# Patient Record
Sex: Female | Born: 1965 | Race: Black or African American | Hispanic: Yes | Marital: Single | Smoking: Never smoker
Health system: Southern US, Community
[De-identification: ages and names within clinical notes are randomized; demographics above are authoritative.]

## PROBLEM LIST (undated history)

## (undated) DIAGNOSIS — F419 Anxiety disorder, unspecified: Secondary | ICD-10-CM

## (undated) DIAGNOSIS — F32A Depression, unspecified: Secondary | ICD-10-CM

## (undated) DIAGNOSIS — J45909 Unspecified asthma, uncomplicated: Secondary | ICD-10-CM

## (undated) DIAGNOSIS — F329 Major depressive disorder, single episode, unspecified: Secondary | ICD-10-CM

## (undated) DIAGNOSIS — C801 Malignant (primary) neoplasm, unspecified: Secondary | ICD-10-CM

## (undated) HISTORY — DX: Malignant (primary) neoplasm, unspecified: C80.1

## (undated) HISTORY — PX: ABDOMINAL HYSTERECTOMY: SHX81

## (undated) HISTORY — PX: OTHER SURGICAL HISTORY: SHX169

## (undated) HISTORY — DX: Anxiety disorder, unspecified: F41.9

## (undated) HISTORY — DX: Major depressive disorder, single episode, unspecified: F32.9

## (undated) HISTORY — PX: TONSILLECTOMY: SUR1361

## (undated) HISTORY — DX: Depression, unspecified: F32.A

---
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
## 2005-06-21 DIAGNOSIS — C801 Malignant (primary) neoplasm, unspecified: Secondary | ICD-10-CM

## 2005-06-21 HISTORY — DX: Malignant (primary) neoplasm, unspecified: C80.1

## 2012-06-21 HISTORY — PX: BRAIN SURGERY: SHX531

## 2016-09-28 ENCOUNTER — Other Ambulatory Visit: Payer: Self-pay | Admitting: Neurology

## 2016-09-28 DIAGNOSIS — I671 Cerebral aneurysm, nonruptured: Secondary | ICD-10-CM

## 2016-10-13 ENCOUNTER — Ambulatory Visit
Admission: RE | Admit: 2016-10-13 | Discharge: 2016-10-13 | Disposition: A | Discharge status: Home / Self Care | Payer: BLUE CROSS/BLUE SHIELD | Source: Ambulatory Visit | Attending: Neurology | Admitting: Neurology

## 2016-10-13 DIAGNOSIS — I671 Cerebral aneurysm, nonruptured: Secondary | ICD-10-CM | POA: Diagnosis present

## 2016-10-13 MED ORDER — IOPAMIDOL (ISOVUE-370) INJECTION 76%
75.0000 mL | Freq: Once | INTRAVENOUS | Status: AC | PRN
  Administered 2016-10-13: 75 mL via INTRAVENOUS

## 2016-12-01 ENCOUNTER — Encounter: Payer: Self-pay | Admitting: Physical Therapy

## 2016-12-01 ENCOUNTER — Ambulatory Visit: Payer: BLUE CROSS/BLUE SHIELD | Attending: Psychiatry | Admitting: Physical Therapy

## 2016-12-01 VITALS — BP 90/60

## 2016-12-01 DIAGNOSIS — M6281 Muscle weakness (generalized): Secondary | ICD-10-CM | POA: Diagnosis present

## 2016-12-01 DIAGNOSIS — M533 Sacrococcygeal disorders, not elsewhere classified: Secondary | ICD-10-CM | POA: Diagnosis not present

## 2016-12-01 DIAGNOSIS — R2689 Other abnormalities of gait and mobility: Secondary | ICD-10-CM | POA: Insufficient documentation

## 2016-12-01 NOTE — Therapy (Signed)
[place] MAIN [facility] SERVICES [address] [place], [place], [address] Phone: [phone]   Fax:  [phone]  Physical Therapy Evaluation  Patient Details  Name: Joann Brown MRN: [id number] Date of Birth: [birth year] Referring Provider: Bernadene Bell, MD   Encounter Date: 12/01/2016      PT End of Session - 12/01/16 1316    Visit Number 1   Number of Visits 12   Date for PT Re-Evaluation 02/23/17   PT Start Time 0910   PT Stop Time 1015   PT Time Calculation (min) 65 min   Activity Tolerance Patient tolerated treatment well;No increased pain   Behavior During Therapy WFL for tasks assessed/performed      Past Medical History:  Diagnosis Date  . Anxiety   . Cancer [facility]) 2007    hysterectomy to remove tumor in uterus   . Depression     Past Surgical History:  Procedure Laterality Date  . ABDOMINAL HYSTERECTOMY     2/2 tumor in uterus   . APPENDECTOMY  1986  . BRAIN SURGERY  2014   coil placement for brain aneurysm     Vitals:   12/01/16 0930  BP: 90/60         Subjective Assessment - 12/01/16 0931    Subjective 1) Pt has experienced pelvic pain (10-12/10) since 2015 at the R lower abdomen.  Pain has gotten worse. Pain radiates to medial knee/ shin to second and third toes like an electric shock at random times in any positions.  Pt is supposed to get an [place] at [facility].  She describes the pain like something is twisted in her R side, almosted ripped. Pt had a hysterectomy in 2007 to remove a uterine tumor. Pt's MD informed her that her  L ovary was dissolved with her R ovary remaining.  Pt also had an appendectomy in 1986 in addition to the removal of 2 cysts over R ovary.  Pain comes on the tying her shoes, pulling knee to chest, and bending. Pt has difficulty with vaccuming due to pain.  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 Pt has pain  in her back when she lifts. 2) SUI with coughing and sneeze.  3) LBP since 2009. Radiating to back of the feet.  Bending and lifting 5-6 x day at work.  Pt has undergone PT without improvement.  4)  Pt had a fall last year 02/2016  and landed on her tailbone.  Pt has tailbone pain with long periods of sitting for > 1 hour and laying on her tummy, walking on her job for 7 hours.  Pain level 8/10   5) constipation  very 2-3 days "It is a nightmare for me" every 3 weeks associated diverticulosis.       Pertinent History Diverticulosis, Brain aneurysm 2014 with coil placement at the top of her head. Since this surgery, pt experience headaches. Pt has had dizziness the past 3 years. Pt's neutrologist and PCP are aware of her Sx. Pt is undergoing sleep study for OSA 6/15.  4 miscarriages, 2 vaginal deliveries, with episiotomy.    Patient Stated Goals decrease R abdominal pain            [facility] PT Assessment - 12/01/16 0957      Assessment   Medical Diagnosis pelvic pain    Referring Provider Bernadene Bell, MD  Precautions   Precautions None     Restrictions   Weight Bearing Restrictions No     Balance Screen   Has the patient fallen in the past 6 months No     Squat   Comments COM more anterior than knees     Single Leg Stance   Comments LOB with shooting from hips up the spine on B ( worst on R) when in R SLS pre Tx, no LOB and more stability post Tx        AROM   Overall AROM  --  hip flexion at 90 deg due to pain ( pre Tx) full ROM ( post      Palpation   SI assessment  shooting pain up spine with palpation at B PSIS, R ASIS more anterior, R coccyx deviation with tenderness/ tensions at coccygeus R ( post Tx: decreased and more mobility into hip flexion)    R SIJ limited mobility    Palpation comment pain along linea alba but no separation            Objective measurements completed on examination: See above findings.        Pelvic Floor Special Questions -  12/01/16 1021    External Perineal Exam through clothing    External Palpation B obturator internus, coccygeus B (R > L)           [facility] Adult PT Treatment/Exercise - 12/01/16 1058      Therapeutic Activites    Therapeutic Activities --  see pt instructions     Manual Therapy   Manual therapy comments R LE long axis distraction, inferior/ superior mob of sacrum, MWM hip abd/ add, STM along coccygeus and sacrotuberous ligament, rotational mob into hip flexion                       PT Long Term Goals - 12/01/16 1313      PT LONG TERM GOAL #1   Title Pt will demo no coccyx deviation R and lno tenderness / tensions at coccygeus across 2 visits in order to minimize pain and be able to sit / stand for longer periods    Time 12   Period Weeks   Status New     PT LONG TERM GOAL #2   Title Pt will be able to demo decreased abdominal scar restrictions in order to progress to deep core strengthening and minimize pain    Time 12   Period Weeks   Status New     PT LONG TERM GOAL #3   Title Pt will demo increaed hip flexion from 90 deg to full ROM and no R SIJ hypomobility/ pelvic girdle obliquities in supine in order to progress to being able to tie shoes    Time 12   Period Weeks   Status New     PT LONG TERM GOAL #4   Title Pt will decrease her [place] score from 41% to < 36 % in order to work and lift at work with less LBP and to walk without less abdominal pain   Time 12   Period Weeks   Status New                Plan - 12/01/16 1317    Clinical Impression Statement Pt is 51 yo female who reports R LQ abdominal pain which radiates over medial thigh/tibia, tailbone pain, constipation , and CLBP with radiating pain down to posterior heel R >  L.  These deficits impact her ability to sti, stand, vaccuum, lift, and perform work duties. Pt 's clinical presentations include coccyx deviation, limited SIJ arthrokinematics mobility, pelvic obliquities, decreased hip  flexion ROM no RLE, decreased balance on RLE, and increased scar restrictions over R LQ of abdomen. Following manual Tx which address coccyx deviation and pelvic obliquities, pt demo'd increased  Hip flexion ROM and more stability with R SLS.  Plan to withhold internal pelvic assessment until PT f/u with the her pending US imaging.     Rehab Potential Good   PT Frequency 1x / week   PT Treatment/Interventions ADLs/Self Care Home Management;Aquatic Therapy;Neuromuscular re-education;Therapeutic exercise;Balance training;Moist Heat;Traction;Patient/family education;Gait training;Stair training;Scar mobilization;Manual lymph drainage;Manual techniques;Energy conservation;Taping;Functional mobility training;Therapeutic activities   Consulted and Agree with Plan of Care Patient      Patient will benefit from skilled therapeutic intervention in order to improve the following deficits and impairments:  Pain, Improper body mechanics, Decreased mobility, Decreased strength, Postural dysfunction, Decreased scar mobility, Hypomobility, Decreased activity tolerance, Decreased balance, Decreased endurance, Decreased range of motion, Decreased coordination, Increased muscle spasms, Decreased safety awareness, Impaired sensation  Visit Diagnosis: Sacrococcygeal disorders, not elsewhere classified  Other abnormalities of gait and mobility  Muscle weakness (generalized)     Problem List There are no active problems to display for this patient.   Jerl Mina ,PT, DPT, E-RYT   12/01/2016, 3:28 PM  [place] MAIN [facility] SERVICES [address] [place], [place], [address] Phone: [phone]   Fax:  [phone]  Name: Joann Brown MRN: [id number] Date of Birth: 1966-06-01

## 2016-12-01 NOTE — Patient Instructions (Signed)
Knee to chest on exhale  5 reps     Lay on L side, pillow between knees Lift L ankle up on exhale with knee between pillows  10 x 2

## 2016-12-10 ENCOUNTER — Encounter: Payer: BLUE CROSS/BLUE SHIELD | Admitting: Physical Therapy

## 2016-12-15 ENCOUNTER — Ambulatory Visit: Payer: BLUE CROSS/BLUE SHIELD | Admitting: Physical Therapy

## 2016-12-24 ENCOUNTER — Ambulatory Visit: Payer: BLUE CROSS/BLUE SHIELD | Attending: Psychiatry | Admitting: Physical Therapy

## 2016-12-24 DIAGNOSIS — R2689 Other abnormalities of gait and mobility: Secondary | ICD-10-CM | POA: Diagnosis present

## 2016-12-24 DIAGNOSIS — M6281 Muscle weakness (generalized): Secondary | ICD-10-CM | POA: Insufficient documentation

## 2016-12-24 DIAGNOSIS — M533 Sacrococcygeal disorders, not elsewhere classified: Secondary | ICD-10-CM | POA: Insufficient documentation

## 2016-12-24 NOTE — Therapy (Addendum)
[place] MAIN [facility] SERVICES [address] [place], [place], [address] Phone: [phone]   Fax:  [phone]  Physical Therapy Treatment  Patient Details  Name: Joann Brown MRN: [id number] Date of Birth: 1966-01-08 Referring Provider: Bernadene Bell, MD   Encounter Date: 12/24/2016      PT End of Session - 12/24/16 1259    Visit Number 2   Number of Visits 12   Date for PT Re-Evaluation 02/23/17   PT Start Time 1010   PT Stop Time 1120   PT Time Calculation (min) 70 min   Activity Tolerance Patient tolerated treatment well;No increased pain   Behavior During Therapy WFL for tasks assessed/performed      Past Medical History:  Diagnosis Date  . Anxiety   . Cancer [facility]) 2007    hysterectomy to remove tumor in uterus   . Depression     Past Surgical History:  Procedure Laterality Date  . ABDOMINAL HYSTERECTOMY     2/2 tumor in uterus   . APPENDECTOMY  1986  . BRAIN SURGERY  2014   coil placement for brain aneurysm     There were no vitals filed for this visit.      Subjective Assessment - 12/24/16 1011    Subjective Pt reported the relief in her back after last session  lasted for half a day. Pt has continued her HEP which has helped to decrease the intensity of LBP from 10+/10 to 7/10. Constipation has improved with bowel movements occuring 2-3 x day instead of every 2-3 days. The remaining the past weeks have been the radiating pain ac ross R thigh and then back of calf to the bottom of the foot to the 2nd-4th toes. Pt also reports numbness in her R hand past the elbow to the 3rd -4th fingers which occurs 2-3x a day "all by itself" even when she is laying all positions.  When she lays on her R side, her R side falls asleep immediately.    Pt went to the ER 2 weeks ago for abdominal pain and US showed no R ovary but the CT scan showed that the R ovary was behind other organs. Pt declined surgery and was referred to a  specialist at [facility] with an appt coming up this month. Pt described the abdominal pain that led her to the ER as if "her hips were opening up like giving birth." She did not feel the hip pain accompany the R abdominal pain in the past.  Pt has been taking Naxoproxen and Advil with decrease in pain but "not enough".  The abdominal pain with the hip pain has been remained since 12/09/16.  Today, pain 5-6/10.      Patient is accompained by: Interpreter   Pertinent History Diverticulosis, Brain aneurysm 2014 with coil placement at the top of her head. Since this surgery, pt experience headaches. Pt has had dizziness the past 3 years. Pt's neutrologist and PCP are aware of her Sx. Pt is undergoing sleep study for OSA 6/15.  4 miscarriages, 2 vaginal deliveries, with episiotomy.    Patient Stated Goals decrease R abdominal pain            [facility] PT Assessment - 12/24/16 1250      Sensation   Light Touch --  decreased sensation at L3 R leg > L.      Palpation   SI assessment  R ASIS symmetrically aligned with L . No deviation of coccyx.  Increased mobility at  R SIJ but palpation there along with ischial rami caused radiating pain                   Pelvic Floor Special Questions - 12/24/16 1251    External Palpation B obt in, coccygeus    Prolapse Posterior Wall  cough: slightly outside introitus, post Tx: inside introitus   Pelvic Floor Internal Exam pt consented verbally with out contraindications  female intepreter in the room   Exam Type Vaginal   Palpation tenderness/ tensions at R bulbospoongious, B  ischicoccygeus and obt int B ,     post Tx: less tenderness/ tensions.   Strength fair squeeze, definite lift           [facility] Adult PT Treatment/Exercise - 12/24/16 1256      Therapeutic Activites    Therapeutic Activities --  see pt instructions     Neuro Re-ed    Neuro Re-ed Details  pain science education w/ paced breathing to minimize mm tensions      Manual Therapy    Manual therapy comments sustained pressure at ischial rami B with internal technique to release coccygeus/ obt int.    Internal Pelvic Floor sustained pressure at problem areas noted in assessment, lightening up to minimize pain.                       PT Long Term Goals - 12/01/16 1313      PT LONG TERM GOAL #1   Title Pt will demo no coccyx deviation R and lno tenderness / tensions at coccygeus across 2 visits in order to minimize pain and be able to sit / stand for longer periods    Time 12   Period Weeks   Status New     PT LONG TERM GOAL #2   Title Pt will be able to demo decreased abdominal scar restrictions in order to progress to deep core strengthening and minimize pain    Time 12   Period Weeks   Status New     PT LONG TERM GOAL #3   Title Pt will demo increaed hip flexion from 90 deg to full ROM and no R SIJ hypomobility/ pelvic girdle obliquities in supine in order to progress to being able to tie shoes    Time 12   Period Weeks   Status New     PT LONG TERM GOAL #4   Title Pt will decrease her [place] score from 41% to < 36 % in order to work and lift at work with less LBP and to walk without less abdominal pain   Time 12   Period Weeks   Status New               Plan - 12/24/16 1300    Clinical Impression Statement Pt returns after her first visit with report of 30% improvement in her LBP as she continues with her HEP. Pt also reported her constipation has improved with daily movements 2-3x /day instead of 2-3 x day following last session when PT applied manual Tx to address hypomobility at R SIJ and deviated coccyx.   Today, pt showed good carry over from last Tx with a more symmetrically aligned pelvic girdle and no deviation of her coccyx. However, through internal vaginal assessment, pt demo'd potential signs of pelvic organ prolapse as indicated by a lowered position of her posterior vaginal wall slightly outside her intriotus. PT suspects this  issue to be associated w/ repeated lifting and handling heavy objects at her job which causes a downward load onto her pelvic floor.  Pt is at risk for worsening of pelvic organ prolapse with her Hx of hysterectomy. Pt would benefit from an order for lifting restrictions at work for 6 weeks while pt undergoes pelvic floor rehab to strengthen her muscles and her deep core mm.    Following Tx today, pt demo'd decreased tenderness and tensions at B coccgyeus and obturator internus mm and R bulbospingious mm. Pt also demo'd proper coordination of her pelvic floor to elicit a more cranial position of her rectum inside her introitus following Tx.   Following Tx today, pt reported her pain decreased from 5-6/10 to 2-3/10.  Suspect pt's radiating LBP is related to her dysfunctions of pelvic floor and lumbopelvic complex ( limited SIJ mobility, overactivity of pelvic floor mm, poor intraabdominal pressure system 2/2 hysetrectomy). Plan to address her radiating Sx in RLE and UE at next sessions and to refer out if not related to musculoskeletal causes. PT plans to communicate with her MD re: recommendation for pt to receive a lifting restriction note for work. Pt continues to benefit from skilled PT with increased frequency of 2x/ week in stead of 1x/week.       Rehab Potential Good   PT Frequency 1x / week   PT Treatment/Interventions ADLs/Self Care Home Management;Aquatic Therapy;Neuromuscular re-education;Therapeutic exercise;Balance training;Moist Heat;Traction;Patient/family education;Gait training;Stair training;Scar mobilization;Manual lymph drainage;Manual techniques;Energy conservation;Taping;Functional mobility training;Therapeutic activities   Consulted and Agree with Plan of Care Patient      Patient will benefit from skilled therapeutic intervention in order to improve the following deficits and impairments:  Pain, Improper body mechanics, Decreased mobility, Decreased strength, Postural dysfunction,  Decreased scar mobility, Hypomobility, Decreased activity tolerance, Decreased balance, Decreased endurance, Decreased range of motion, Decreased coordination, Increased muscle spasms, Decreased safety awareness, Impaired sensation  Visit Diagnosis: Sacrococcygeal disorders, not elsewhere classified  Other abnormalities of gait and mobility  Muscle weakness (generalized)     Problem List There are no active problems to display for this patient.   Jerl Mina ,PT, DPT, E-RYT  12/24/2016, 1:13 PM  [place] MAIN [facility] SERVICES [address] [place], [place], [address] Phone: [phone]   Fax:  [phone]  Name: Cayman Brogden MRN: [id number] Date of Birth: May 11, 1966

## 2016-12-28 ENCOUNTER — Ambulatory Visit: Payer: BLUE CROSS/BLUE SHIELD | Admitting: Physical Therapy

## 2016-12-28 DIAGNOSIS — M533 Sacrococcygeal disorders, not elsewhere classified: Secondary | ICD-10-CM | POA: Diagnosis not present

## 2016-12-28 DIAGNOSIS — R2689 Other abnormalities of gait and mobility: Secondary | ICD-10-CM

## 2016-12-28 DIAGNOSIS — M6281 Muscle weakness (generalized): Secondary | ICD-10-CM

## 2016-12-28 NOTE — Patient Instructions (Signed)
Prone Heel Press for strengthening sacro-iliac joints  1. Lie on your belly. If you have an arch in your low back or it feels umcomfortable, place a pillow under your low belly/hips to make sure your low back feel comfortable.   2. Place our forehead on top of your palms.      Widen your knees apart for starting position.   3. Inhale, feel belly and low back expand  4. Exhale, feel belly hug in, press heel together and count aloud for 5 sec. Then relax the heel squeezing.  Perform 10 reps of 5 sec holds. 2 sets/ day.    If you feel entire buttock tighten too much or feel low back pain, apply 50% less effort. As you press your heel together, you will feel as if your pubic bone (front of your pelvis) and sacrum (back of your pelvis) gentle move towards each other or your low abdominal muscles hug in more.         Frog stretch: laying on belly with pillow under hips, knees bent, inhale do nothing, exhale let ankles fall apart  10 reps    _______________   childs pose rocking   5 reps   Standing version  Center, left, right  5 reps     and relaxation with pillows under belly, forehead, between knees  76min to stretch back after work

## 2016-12-28 NOTE — Addendum Note (Signed)
Addended by: Jerl Mina on: 12/28/2016 11:28 PM   Modules accepted: Orders

## 2016-12-28 NOTE — Therapy (Signed)
[place] MAIN [facility] SERVICES [address] [place], [place], [address] Phone: [phone]   Fax:  [phone]  Physical Therapy Treatment  Patient Details  Name: Joann Brown MRN: [id number] Date of Birth: 03/19/1966 Referring Provider: Bernadene Bell, MD   Encounter Date: 12/28/2016      PT End of Session - 12/28/16 1336    Visit Number 3   Number of Visits 12   Date for PT Re-Evaluation 02/23/17   PT Start Time 2035   PT Stop Time 1335   PT Time Calculation (min) 60 min   Activity Tolerance Patient tolerated treatment well;No increased pain   Behavior During Therapy WFL for tasks assessed/performed      Past Medical History:  Diagnosis Date  . Anxiety   . Cancer [facility]) 2007    hysterectomy to remove tumor in uterus   . Depression     Past Surgical History:  Procedure Laterality Date  . ABDOMINAL HYSTERECTOMY     2/2 tumor in uterus   . APPENDECTOMY  1986  . BRAIN SURGERY  2014   coil placement for brain aneurysm     There were no vitals filed for this visit.      Subjective Assessment - 12/28/16 1238    Subjective Pt reported her pain relief to a level of 2-3/10 from 5-6/10 for a few hours after last session. The following days, the pain went up 8-9+/10 but when she did the new exercises, the pain relaxed quite a bit to a level of 5/10.  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  Pt felt less pain with less lifting. Pt saw a doctor about her radiating pain in her shoulders. MD told her she has a pinched nerve in her neck but imaging was performed.  The pain in the R arm and fingers is better by 30% and numbness and tingling have lessened. Pt will continue to see her MD for the arm / shohulder pain after performing her exercises he prescribed her. Pt reported she will undergo breast reduction surgery in 6-8 weeks.       Patient is accompained by: Interpreter   Pertinent History Diverticulosis,  Brain aneurysm 2014 with coil placement at the top of her head. Since this surgery, pt experience headaches. Pt has had dizziness the past 3 years. Pt's neutrologist and PCP are aware of her Sx. Pt is undergoing sleep study for OSA 6/15.  4 miscarriages, 2 vaginal deliveries, with episiotomy.    Patient Stated Goals decrease R abdominal pain            [facility] PT Assessment - 12/28/16 1249      AROM   Overall AROM  --  R sideflexion/rotation limited by pain ~20% preTx,45%postTx     Palpation   Spinal mobility increased paraspinal mm tensions along R > L, T12 / S1 reproduced radiating pain.  ( post Tx: no radiating pain)    SI assessment  hypomobility along lateral border, coccgyeus ( post manual mob in [place] :, less tenderness )                      [facility] Adult PT Treatment/Exercise - 12/28/16 1328      Exercises   Exercises --  see pt instructions     Moist Heat Therapy   Number Minutes Moist Heat 5 Minutes   Moist Heat Location --  back and buttocks  Manual Therapy   Manual therapy comments long axis distraction RLE, FADDIR mobs Grade II, hip ext, flex, rotational mob iliac crest over sacrum, inferioro suprerior mob of sacrum, STM at coccgeus.   STM at R paraspinal, Grade III mob along T10-12                  PT Education - 12/28/16 1335    Education provided Yes   Education Details HEP   Person(s) Educated Patient   Methods Explanation;Demonstration;Tactile cues;Verbal cues;Handout   Comprehension Returned demonstration;Verbalized understanding             PT Long Term Goals - 12/01/16 1313      PT LONG TERM GOAL #1   Title Pt will demo no coccyx deviation R and lno tenderness / tensions at coccygeus across 2 visits in order to minimize pain and be able to sit / stand for longer periods    Time 12   Period Weeks   Status New     PT LONG TERM GOAL #2   Title Pt will be able to demo decreased abdominal scar restrictions in order to  progress to deep core strengthening and minimize pain    Time 12   Period Weeks   Status New     PT LONG TERM GOAL #3   Title Pt will demo increaed hip flexion from 90 deg to full ROM and no R SIJ hypomobility/ pelvic girdle obliquities in supine in order to progress to being able to tie shoes    Time 12   Period Weeks   Status New     PT LONG TERM GOAL #4   Title Pt will decrease her [place] score from 41% to < 36 % in order to work and lift at work with less LBP and to walk without less abdominal pain   Time 12   Period Weeks   Status New               Plan - 12/28/16 1338    Clinical Impression Statement Pt reports the exercises from last session have helped her to minimize her pain when it escalates. Today's sessions focused on decreasing her R radiating pain  which was found to be caused by increased mm tensions of paraspinals on R, hypomobility at T10-12  and R lateral border of sacroiliac crest. Following external manual Tx, pt demo'd increased spinal side flexion and rotation to the R and reported decreased to 1/10 with no radiating pain down RLE.  Pt continues to benefit from skilled PT with a frequency of 2x/ week.  Plan to reassess pelvic floor mm tensions at next session.    Rehab Potential Good   PT Frequency 2x / week   PT Treatment/Interventions ADLs/Self Care Home Management;Aquatic Therapy;Neuromuscular re-education;Therapeutic exercise;Balance training;Moist Heat;Traction;Patient/family education;Gait training;Stair training;Scar mobilization;Manual lymph drainage;Manual techniques;Energy conservation;Taping;Functional mobility training;Therapeutic activities   Consulted and Agree with Plan of Care Patient      Patient will benefit from skilled therapeutic intervention in order to improve the following deficits and impairments:  Pain, Improper body mechanics, Decreased mobility, Decreased strength, Postural dysfunction, Decreased scar mobility, Hypomobility, Decreased  activity tolerance, Decreased endurance, Decreased range of motion, Decreased coordination, Increased muscle spasms, Decreased safety awareness, Impaired sensation  Visit Diagnosis: Sacrococcygeal disorders, not elsewhere classified  Other abnormalities of gait and mobility  Muscle weakness (generalized)     Problem List There are no active problems to display for this patient.   Jerl Mina 12/28/2016, 11:19 PM  [place] MAIN [facility] SERVICES [address] [place], [place], [address] Phone: [phone]   Fax:  [phone]  Name: Joann Brown MRN: [id number] Date of Birth: 1966/05/05

## 2016-12-30 ENCOUNTER — Ambulatory Visit: Payer: BLUE CROSS/BLUE SHIELD | Admitting: Physical Therapy

## 2016-12-30 DIAGNOSIS — M6281 Muscle weakness (generalized): Secondary | ICD-10-CM

## 2016-12-30 DIAGNOSIS — M533 Sacrococcygeal disorders, not elsewhere classified: Secondary | ICD-10-CM

## 2016-12-30 DIAGNOSIS — R2689 Other abnormalities of gait and mobility: Secondary | ICD-10-CM

## 2016-12-30 NOTE — Patient Instructions (Signed)
Stretches for whole back:  Child pose rocking  Left and right stretches     Standing version of child's rock     Stretches for R midback Laying on back  1) Knees to chest with towel under knees     2) Rock knees side to side 5-10 deg      3) Laying on back, scoot hips to Right side, drop knees to Left with pillow under knee, and knee between knee    Deep core (knee out) 6 min x 2 x day

## 2016-12-30 NOTE — Therapy (Signed)
[place] MAIN [facility] SERVICES [address] [place], [place], [address] Phone: [phone]   Fax:  [phone]  Physical Therapy Treatment  Patient Details  Name: Joann Brown MRN: [id number] Date of Birth: 1965-12-23 Referring Provider: Bernadene Bell, MD   Encounter Date: 12/30/2016      PT End of Session - 12/30/16 2343    Visit Number 4   Number of Visits 12   Date for PT Re-Evaluation 03/18/17   PT Start Time 1000   PT Stop Time 1100   PT Time Calculation (min) 60 min   Activity Tolerance Patient tolerated treatment well;No increased pain   Behavior During Therapy WFL for tasks assessed/performed      Past Medical History:  Diagnosis Date  . Anxiety   . Cancer [facility]) 2007    hysterectomy to remove tumor in uterus   . Depression     Past Surgical History:  Procedure Laterality Date  . ABDOMINAL HYSTERECTOMY     2/2 tumor in uterus   . APPENDECTOMY  1986  . BRAIN SURGERY  2014   coil placement for brain aneurysm     There were no vitals filed for this visit.      Subjective Assessment - 12/30/16 1007    Subjective Pt reported her pain relief from last Tx lasted for a few hours until she had to run around on errands. Pain radiated down her leg at 50% less intensity. Pt feels her hip and knee hurt quite a bit but no radiating pain.              [facility] PT Assessment - 12/30/16 2337      Palpation   Spinal mobility no radiating pain w/ palpation at throacic segments but ore Tx, pt had flinching response , post Tx more tolerable to palpation    SI assessment  decreased tenderness/ tensions at distal border of sacrum , R lateral border.                    Pelvic Floor Special Questions - 12/30/16 2339    Prolapse --  less posterior descent   Pelvic Floor Internal Exam pt consented verbally with out contraindications  female intepreter in the room   Exam Type Vaginal   Palpation point  tenderness/ tensions at 5 and 7 o''clock ( scar)    post Tx: less tenderness/ tensions.           [facility] Adult PT Treatment/Exercise - 12/30/16 2341      Exercises   Exercises --  see pt instructions     Moist Heat Therapy   Number Minutes Moist Heat 10 Minutes, applied while performing pelvic floor manual Tx in hooklying   Moist Heat Location --  back , neck      Manual Therapy   Internal Pelvic Floor thiele massage at tender/ tension spot, internally.  applied external sustained pressure at ischial rami B   MWM for hip abd, add knee ext/ flex                PT Education - 12/30/16 2343    Education provided Yes   Education Details HEP   Person(s) Educated Patient   Methods Explanation;Demonstration;Tactile cues;Verbal cues;Handout   Comprehension Returned demonstration;Verbalized understanding             PT Long Term Goals - 12/01/16 1313      PT LONG TERM GOAL #1   Title Pt will  demo no coccyx deviation R and lno tenderness / tensions at coccygeus across 2 visits in order to minimize pain and be able to sit / stand for longer periods    Time 12   Period Weeks   Status New     PT LONG TERM GOAL #2   Title Pt will be able to demo decreased abdominal scar restrictions in order to progress to deep core strengthening and minimize pain    Time 12   Period Weeks   Status New     PT LONG TERM GOAL #3   Title Pt will demo increaed hip flexion from 90 deg to full ROM and no R SIJ hypomobility/ pelvic girdle obliquities in supine in order to progress to being able to tie shoes    Time 12   Period Weeks   Status New     PT LONG TERM GOAL #4   Title Pt will decrease her [place] score from 41% to < 36 % in order to work and lift at work with less LBP and to walk without less abdominal pain   Time 12   Period Weeks   Status New               Plan - 12/30/16 2343    Clinical Impression Statement Pt demo'd improvement with less mm tensions/ tenderness  at her pelvic floor mm. Addressed perineal scar and thoracic mm releases today. Pt continues to benefit from skilled PT.Plan to address thoracolumbar strengthening at next visit.    Rehab Potential Good   PT Frequency 2x / week   PT Treatment/Interventions ADLs/Self Care Home Management;Aquatic Therapy;Neuromuscular re-education;Therapeutic exercise;Balance training;Moist Heat;Traction;Patient/family education;Gait training;Stair training;Scar mobilization;Manual lymph drainage;Manual techniques;Energy conservation;Taping;Functional mobility training;Therapeutic activities   Consulted and Agree with Plan of Care Patient      Patient will benefit from skilled therapeutic intervention in order to improve the following deficits and impairments:  Pain, Improper body mechanics, Decreased mobility, Decreased strength, Postural dysfunction, Decreased scar mobility, Hypomobility, Decreased activity tolerance, Decreased endurance, Decreased range of motion, Decreased coordination, Increased muscle spasms, Decreased safety awareness, Impaired sensation  Visit Diagnosis: Sacrococcygeal disorders, not elsewhere classified  Other abnormalities of gait and mobility  Muscle weakness (generalized)     Problem List There are no active problems to display for this patient.   Jerl Mina ,PT, DPT, E-RYT  12/30/2016, 11:45 PM  [place] MAIN [facility] SERVICES [address] [place], [place], [address] Phone: [phone]   Fax:  [phone]  Name: Joann Brown MRN: [id number] Date of Birth: 05-22-1966

## 2017-01-05 ENCOUNTER — Ambulatory Visit: Payer: BLUE CROSS/BLUE SHIELD | Admitting: Physical Therapy

## 2017-01-19 ENCOUNTER — Ambulatory Visit: Payer: BLUE CROSS/BLUE SHIELD | Attending: Psychiatry | Admitting: Physical Therapy

## 2017-01-19 DIAGNOSIS — R2689 Other abnormalities of gait and mobility: Secondary | ICD-10-CM | POA: Diagnosis present

## 2017-01-19 DIAGNOSIS — R200 Anesthesia of skin: Secondary | ICD-10-CM | POA: Insufficient documentation

## 2017-01-19 DIAGNOSIS — M533 Sacrococcygeal disorders, not elsewhere classified: Secondary | ICD-10-CM | POA: Insufficient documentation

## 2017-01-19 DIAGNOSIS — M25512 Pain in left shoulder: Secondary | ICD-10-CM | POA: Diagnosis present

## 2017-01-19 DIAGNOSIS — M6281 Muscle weakness (generalized): Secondary | ICD-10-CM | POA: Diagnosis present

## 2017-01-19 DIAGNOSIS — G8929 Other chronic pain: Secondary | ICD-10-CM | POA: Insufficient documentation

## 2017-01-19 DIAGNOSIS — M25511 Pain in right shoulder: Secondary | ICD-10-CM | POA: Diagnosis present

## 2017-01-19 DIAGNOSIS — R202 Paresthesia of skin: Secondary | ICD-10-CM | POA: Insufficient documentation

## 2017-01-19 NOTE — Patient Instructions (Signed)
Laying on back   Happy baby pose With strap on feet  Knees towards armpits  Feet to sky     5 reps    _________  Prone Heel Press for strengthening sacro-iliac joints  1. Lie on your belly. If you have an arch in your low back or it feels umcomfortable, place a pillow under your low belly/hips to make sure your low back feel comfortable.   2. Place our forehead on top of your palms.      Widen your knees apart for starting position.   3. Inhale, feel belly and low back expand  4. Exhale, feel belly hug in, press heel together and count aloud for 5 sec. Then relax the heel squeezing.  Perform 10 reps of 5 sec holds. 2 sets/ day.    If you feel entire buttock tighten too much or feel low back pain, apply 50% less effort. As you press your heel together, you will feel as if your pubic bone (front of your pelvis) and sacrum (back of your pelvis) gentle move towards each other or your low abdominal muscles hug in more.

## 2017-01-20 NOTE — Therapy (Signed)
[place] MAIN [facility] SERVICES [address] [place], [place], [address] Phone: [phone]   Fax:  [phone]  Physical Therapy Treatment  Patient Details  Name: Joann Brown MRN: [id number] Date of Birth: 1966/05/04 Referring Provider: Bernadene Bell, MD   Encounter Date: 01/19/2017      PT End of Session - 01/20/17 2243    Visit Number 5   Number of Visits 12   Date for PT Re-Evaluation 03/18/17   PT Start Time 1000   PT Stop Time 1100   PT Time Calculation (min) 60 min   Activity Tolerance Patient tolerated treatment well;No increased pain   Behavior During Therapy WFL for tasks assessed/performed      Past Medical History:  Diagnosis Date  . Anxiety   . Cancer [facility]) 2007    hysterectomy to remove tumor in uterus   . Depression     Past Surgical History:  Procedure Laterality Date  . ABDOMINAL HYSTERECTOMY     2/2 tumor in uterus   . APPENDECTOMY  1986  . BRAIN SURGERY  2014   coil placement for brain aneurysm     There were no vitals filed for this visit.      Subjective Assessment - 01/20/17 2247    Subjective Pt reports the radiating pain in her RLE continues to occur at 50% less intensity but continnues to occur when she is walking, sitting, and sit to stand.             [facility] PT Assessment - 01/20/17 2230      Palpation   SI assessment  minor tensions along coccygeus, pain w/ hip flex (endfeel) pre Tx, no pain post Tx.   limited R SIJ mobility                      [facility] Adult PT Treatment/Exercise - 01/20/17 2240      Neuro Re-ed    Neuro Re-ed Details  see pt instructions     Moist Heat Therapy   Number Minutes Moist Heat 10 Minutes   Moist Heat Location --  back, posterior buttocks     Manual Therapy   Internal Pelvic Floor AP mob SIJ lateral border R, R hip flex, adduct, IR, ER, Grade II    prone: PA mobs R SIJ grade II                 PT Education -  01/20/17 2243    Education provided Yes   Education Details HEP   Person(s) Educated Patient   Methods Explanation;Demonstration;Tactile cues;Verbal cues;Handout   Comprehension Returned demonstration;Verbalized understanding             PT Long Term Goals - 01/20/17 2245      PT LONG TERM GOAL #1   Title Pt will demo no coccyx deviation R and no tenderness / tensions at coccygeus across 2 visits in order to minimize pain and be able to sit / stand for longer periods    Time 12   Period Weeks   Status Achieved     PT LONG TERM GOAL #2   Title Pt will be able to demo decreased abdominal scar restrictions in order to progress to deep core strengthening and minimize pain    Time 12   Period Weeks   Status Achieved     PT LONG TERM GOAL #3   Title Pt will demo increaed hip flexion from 90 deg to  full ROM and no R SIJ hypomobility/ pelvic girdle obliquities in supine in order to progress to being able to tie shoes    Time 12   Period Weeks   Status Achieved     PT LONG TERM GOAL #4   Title Pt will decrease her [place] score from 41% to < 36 % in order to work and lift at work with less LBP and to walk without less abdominal pain   Time 12   Period Weeks   Status On-going     PT LONG TERM GOAL #5   Title Pt will be able to tie her shoes with less pain in her R groin/ low abdominal area in order perform ADLs.   Time 12   Period Weeks   Status New               Plan - 01/20/17 2244    Clinical Impression Statement Pt showed improved R sacroiliac joint mobility post Tx and was able to achieve end hip flexion in supine. Pt is continuing to progress towards her goals with skilled PT.    Rehab Potential Good   PT Frequency 2x / week   PT Treatment/Interventions ADLs/Self Care Home Management;Aquatic Therapy;Neuromuscular re-education;Therapeutic exercise;Balance training;Moist Heat;Traction;Patient/family education;Gait training;Stair training;Scar mobilization;Manual lymph  drainage;Manual techniques;Energy conservation;Taping;Functional mobility training;Therapeutic activities   Consulted and Agree with Plan of Care Patient      Patient will benefit from skilled therapeutic intervention in order to improve the following deficits and impairments:  Pain, Improper body mechanics, Decreased mobility, Decreased strength, Postural dysfunction, Decreased scar mobility, Hypomobility, Decreased activity tolerance, Decreased endurance, Decreased range of motion, Decreased coordination, Increased muscle spasms, Decreased safety awareness, Impaired sensation  Visit Diagnosis: Sacrococcygeal disorders, not elsewhere classified  Other abnormalities of gait and mobility  Muscle weakness (generalized)     Problem List There are no active problems to display for this patient.   Jerl Mina ,PT, DPT, E-RYT  01/20/2017, 10:47 PM  [place] MAIN [facility] SERVICES [address] [place], [place], [address] Phone: [phone]   Fax:  [phone]  Name: Joann Brown MRN: [id number] Date of Birth: 04/28/1966

## 2017-01-26 ENCOUNTER — Ambulatory Visit: Payer: BLUE CROSS/BLUE SHIELD | Admitting: Physical Therapy

## 2017-01-26 DIAGNOSIS — M533 Sacrococcygeal disorders, not elsewhere classified: Secondary | ICD-10-CM

## 2017-01-26 DIAGNOSIS — M6281 Muscle weakness (generalized): Secondary | ICD-10-CM

## 2017-01-26 DIAGNOSIS — R2689 Other abnormalities of gait and mobility: Secondary | ICD-10-CM

## 2017-01-26 NOTE — Patient Instructions (Addendum)
Twice a day   Stretching  Open book   Happy Baby  Pose    Fingers gently places inside iliac crest with knees rocking back and forth 30 reps    Strengthening:   Deep core level 2 ( 6 min)_

## 2017-01-26 NOTE — Therapy (Signed)
[place] MAIN [facility] SERVICES [address] [place], [place], [address] Phone: [phone]   Fax:  [phone]  Physical Therapy Treatment  Patient Details  Name: Arvella Massingale MRN: [id number] Date of Birth: 1966/04/27 Referring Provider: Bernadene Bell, MD   Encounter Date: 01/26/2017      PT End of Session - 01/26/17 1059    Visit Number 6   Number of Visits 12   Date for PT Re-Evaluation 03/18/17   PT Start Time 1020   PT Stop Time 1100   PT Time Calculation (min) 40 min   Activity Tolerance Patient tolerated treatment well;No increased pain   Behavior During Therapy WFL for tasks assessed/performed      Past Medical History:  Diagnosis Date  . Anxiety   . Cancer [facility]) 2007    hysterectomy to remove tumor in uterus   . Depression     Past Surgical History:  Procedure Laterality Date  . ABDOMINAL HYSTERECTOMY     2/2 tumor in uterus   . APPENDECTOMY  1986  . BRAIN SURGERY  2014   coil placement for brain aneurysm     There were no vitals filed for this visit.      Subjective Assessment - 01/26/17 1026    Subjective Pt did not have radiating pain during work after leaving her last PT session. Pt did not have radiating pain all last week.  The remaining pain occured at R thigh and calf , low back and front belly area. This pain worsened with increased family obligations and caretaking for daughter and nieces. Today pt feels pain theback and the front and radiating pain down the thigh to the above knee.               [facility] PT Assessment - 01/26/17 1058      Palpation   Palpation comment fascial restrictions medial to R iliac crest, R LQ  ( post Tx: no radiating pain)                      [facility] Adult PT Treatment/Exercise - 01/26/17 1056      Exercises   Exercises --  see pt instructions     Manual Therapy   Internal Pelvic Floor AP mob SIJ lateral border R, R hip flex, adduct, IR, ER,  Grade II , fascial release over R LQ , skin rolling,   prone: PA mobs R SIJ grade II                 PT Education - 01/26/17 1059    Education provided Yes   Education Details HEP   Person(s) Educated Patient   Methods Explanation;Demonstration;Tactile cues;Verbal cues;Handout   Comprehension Returned demonstration;Verbalized understanding             PT Long Term Goals - 01/26/17 1104      PT LONG TERM GOAL #1   Title Pt will demo no coccyx deviation R and no tenderness / tensions at coccygeus across 2 visits in order to minimize pain and be able to sit / stand for longer periods    Time 12   Period Weeks   Status Achieved     PT LONG TERM GOAL #2   Title Pt will be able to demo decreased abdominal scar restrictions in order to progress to deep core strengthening and minimize pain    Time 12   Period Weeks   Status Achieved  PT LONG TERM GOAL #3   Title Pt will demo increaed hip flexion from 90 deg to full ROM and no R SIJ hypomobility/ pelvic girdle obliquities in supine in order to progress to being able to tie shoes    Time 12   Period Weeks   Status Achieved     PT LONG TERM GOAL #4   Title Pt will decrease her [place] score from 41% to < 36 % in order to work and lift at work with less LBP and to walk without less abdominal pain   Time 12   Period Weeks   Status On-going     PT LONG TERM GOAL #5   Title Pt will be able to tie her shoes with less pain in her R groin/ low abdominal area in order perform ADLs.   Time 12   Period Weeks   Status On-going               Plan - 01/26/17 1100    Clinical Impression Statement Pt showed good carry over with increased hip flex/flex/ER/IR.  Pt's radiating pain on R thigh centralized to the R iliac crest. PT suspects ilioinguinal nn involvement. Plan to continue addressing the area medial to R ilica crest and femoral triangle at next session and progress pt to strengthening exercises ( multifidis) . Pt  continues to benefit from skilled PT.    Rehab Potential Good   PT Frequency 2x / week   PT Treatment/Interventions ADLs/Self Care Home Management;Aquatic Therapy;Neuromuscular re-education;Therapeutic exercise;Balance training;Moist Heat;Traction;Patient/family education;Gait training;Stair training;Scar mobilization;Manual lymph drainage;Manual techniques;Energy conservation;Taping;Functional mobility training;Therapeutic activities   Consulted and Agree with Plan of Care Patient      Patient will benefit from skilled therapeutic intervention in order to improve the following deficits and impairments:  Pain, Improper body mechanics, Decreased mobility, Decreased strength, Postural dysfunction, Decreased scar mobility, Hypomobility, Decreased activity tolerance, Decreased endurance, Decreased range of motion, Decreased coordination, Increased muscle spasms, Decreased safety awareness, Impaired sensation  Visit Diagnosis: Sacrococcygeal disorders, not elsewhere classified  Other abnormalities of gait and mobility  Muscle weakness (generalized)     Problem List There are no active problems to display for this patient.   Joann Brown ,PT, DPT, E-RYT  01/26/2017, 11:04 AM  [place] MAIN [facility] SERVICES [address] [place], [place], [address] Phone: [phone]   Fax:  [phone]  Name: Denell Cothern MRN: [id number] Date of Birth: 1966/04/05

## 2017-02-02 ENCOUNTER — Encounter: Payer: BLUE CROSS/BLUE SHIELD | Admitting: Physical Therapy

## 2017-02-09 ENCOUNTER — Ambulatory Visit: Payer: BLUE CROSS/BLUE SHIELD | Admitting: Physical Therapy

## 2017-02-16 ENCOUNTER — Ambulatory Visit: Payer: BLUE CROSS/BLUE SHIELD | Admitting: Physical Therapy

## 2017-02-16 DIAGNOSIS — M6281 Muscle weakness (generalized): Secondary | ICD-10-CM

## 2017-02-16 DIAGNOSIS — M533 Sacrococcygeal disorders, not elsewhere classified: Secondary | ICD-10-CM | POA: Diagnosis not present

## 2017-02-16 DIAGNOSIS — G8929 Other chronic pain: Secondary | ICD-10-CM

## 2017-02-16 DIAGNOSIS — R202 Paresthesia of skin: Secondary | ICD-10-CM

## 2017-02-16 DIAGNOSIS — M25511 Pain in right shoulder: Secondary | ICD-10-CM

## 2017-02-16 DIAGNOSIS — R200 Anesthesia of skin: Secondary | ICD-10-CM

## 2017-02-16 DIAGNOSIS — M25512 Pain in left shoulder: Secondary | ICD-10-CM

## 2017-02-16 DIAGNOSIS — R2689 Other abnormalities of gait and mobility: Secondary | ICD-10-CM

## 2017-02-16 NOTE — Therapy (Signed)
[place] MAIN [facility] SERVICES [address] [place], [place], [address] Phone: [phone]   Fax:  [phone]  Physical Therapy Treatment / Progress Note   Patient Details  Name: Joann Brown MRN: [id number] Date of Birth: 22-Nov-1965 Referring Provider: Bernadene Bell, MD   Encounter Date: 02/16/2017      PT End of Session - 02/16/17 1003    Visit Number 7   Number of Visits    Date for PT Re-Evaluation 05/11/17   PT Start Time 0910   PT Stop Time 1004   PT Time Calculation (min) 54 min   Activity Tolerance Patient tolerated treatment well;No increased pain   Behavior During Therapy WFL for tasks assessed/performed      Past Medical History:  Diagnosis Date  . Anxiety   . Cancer [facility]) 2007    hysterectomy to remove tumor in uterus   . Depression     Past Surgical History:  Procedure Laterality Date  . ABDOMINAL HYSTERECTOMY     2/2 tumor in uterus   . APPENDECTOMY  1986  . BRAIN SURGERY  2014   coil placement for brain aneurysm     There were no vitals filed for this visit.      Subjective Assessment - 02/16/17 0913    Subjective Pt reported her grandson was born two weeks ago which is why she missed her last appt. She has been under stress with caring for her dtr and her delivery and she did not have time to do her exercises. She feels the R side pain at the same level of pain and the radiating pain down R leg occurs on and off to the heel. The radiating pain stops after 15 mins instead of 30-45 min. Radiating pain is occuring more frequent.  [facility] PT Assessment - 02/16/17 0949      AROM   Overall AROM  --  hip flex, IR, add  in supine      Palpation   SI assessment  minor tenderness at R sacrotuberous ligament,  normal arthrokinematics mobility of SIJ                   Pelvic Floor Special Questions - 02/16/17 0951    Prolapse --  less posterior descent   Pelvic Floor  Internal Exam pt consented verbally with out contraindications  female intepreter in the room   Exam Type Vaginal   Palpation no point tenderness/ tensions at 5 and 7 o''clock ( scar), tendenress/ tensions at obt int R, and at ischiocavernosus attachment at pubic symphysis/ urethra compresse B . Noted increased cranial movement of urogenital mm with exhalation post Tx     post Tx: less tenderness/ tensions.           [facility] Adult PT Treatment/Exercise - 02/16/17 0955      Neuro Re-ed    Neuro Re-ed Details  see pt instructions     Manual Therapy   Internal Pelvic Floor STM, MET at obt int R, and fascial release with coordination breathing at distal portion of pubic symphysis and at suprapubic area                 PT Education - 02/16/17 1000    Education provided Yes   Education Details HEP   Person(s) Educated Patient   Methods Explanation;Demonstration;Tactile cues;Verbal cues;Handout   Comprehension Returned demonstration;Verbalized understanding  PT Long Term Goals - 02/16/17 1305      PT LONG TERM GOAL #1   Title Pt will demo no coccyx deviation R and no tenderness / tensions at coccygeus across 2 visits in order to minimize pain and be able to sit / stand for longer periods    Time 12   Period Weeks   Status Achieved     PT LONG TERM GOAL #2   Title Pt will be able to demo decreased abdominal scar restrictions in order to progress to deep core strengthening and minimize pain    Time 12   Period Weeks   Status Achieved     PT LONG TERM GOAL #3   Title Pt will demo increaed hip flexion from 90 deg to full ROM and no R SIJ hypomobility/ pelvic girdle obliquities in supine in order to progress to being able to tie shoes    Time 12   Period Weeks   Status Achieved     PT LONG TERM GOAL #4   Title Pt will decrease her [place] score from 41% to < 36 % in order to work and lift at work with less LBP and to walk without less abdominal pain   Time 12    Period Weeks   Status On-going     PT LONG TERM GOAL #5   Title Pt will be able to tie her shoes with less pain in her R groin/ low abdominal area in order perform ADLs.   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 Pt  has regained ROM in her R hip into full fleion/ER/abduction for shoe tying and the radiating pain down or RLE eases more quickly within 15 min instead of 30-45 min. Pt has shown signficantly decreased pelvic floor mm tenderness/ tensions, a more symmtrically aligned coccyx, significantly less abdominal and perineal scar restrictions. These improvements have facililated increased sacroiliac joint and pelvic girdle mobility for improved ROM. Pt continues to require strengthening of glut muscles and throacolumabr system to minimzie relapse of Sx. Pt also reports she has had BUE radiating pain with numbness and tingling to fingers which impact her lifting and carry abilities at work and home. Adding these impairments have been added to re-cert in order to treat these body parts at future visits. Pt continues to benefit from skilled PT.       Rehab Potential Good   PT Frequency 1x / week   PT Treatment/Interventions ADLs/Self Care Home Management;Aquatic Therapy;Neuromuscular re-education;Therapeutic exercise;Balance training;Moist Heat;Traction;Patient/family education;Gait training;Stair training;Scar mobilization;Manual lymph drainage;Manual  techniques;Energy conservation;Taping;Functional mobility training;Therapeutic activities   Consulted and Agree with Plan of Care Patient      Patient will benefit from  skilled therapeutic intervention in order to improve the following deficits and impairments:  Pain, Improper body mechanics, Decreased mobility, Decreased strength, Postural dysfunction, Decreased scar mobility, Hypomobility, Decreased activity tolerance, Decreased endurance, Decreased range of motion, Decreased coordination, Increased muscle spasms, Decreased safety awareness, Impaired sensation  Visit Diagnosis: Sacrococcygeal disorders, not elsewhere classified  Other abnormalities of gait and mobility  Muscle weakness (generalized)  Chronic right shoulder pain  Chronic left shoulder pain  Numbness and tingling in both hands     Problem List There are no active problems to display for this patient.   Jerl Mina ,PT, DPT, E-RYT  02/16/2017, 1:19 PM  [place] MAIN [facility] SERVICES [address] [place], [place], [address] Phone: [phone]   Fax:  [phone]  Name: Joann Brown MRN: [id number] Date of Birth: 06/02/1966

## 2017-02-16 NOTE — Patient Instructions (Addendum)
  standing one leg forward , knees unlocked,  Lower traps/ lats: 1. Band over top of door, pull down by pockets 2. Laying on your band "A" to "W" with deep core engaged    band under heels while laying on back w/ knees bent   Laying down "W" exercise  10 reps x 2 sets   Band is placed under feet, knees bent, feet are hip width apart Hold band with thumbs point out, keep upper arm and elbow touching the bed the whole time  - inhale and then exhale pull bands by bending elbows hands move in a "w"  (feel shoulder blades squeezing)   _____  Handling grandbaby:  Hold baby to you not lean hip to him, equal weight through legs  minisquat to pick  Stand semi tandem at changing table at 45 deg or when soothing/ rocking   Feeding baby with pillow under him, pillow behind your back   _____  Press opposite foot down at 4 corners while moving opposite knee during deep core level 2 ( knee out)

## 2017-03-10 ENCOUNTER — Ambulatory Visit: Payer: BLUE CROSS/BLUE SHIELD | Attending: Psychiatry | Admitting: Physical Therapy

## 2017-03-10 DIAGNOSIS — M25512 Pain in left shoulder: Secondary | ICD-10-CM | POA: Insufficient documentation

## 2017-03-10 DIAGNOSIS — R2689 Other abnormalities of gait and mobility: Secondary | ICD-10-CM | POA: Insufficient documentation

## 2017-03-10 DIAGNOSIS — M25511 Pain in right shoulder: Secondary | ICD-10-CM | POA: Diagnosis present

## 2017-03-10 DIAGNOSIS — R202 Paresthesia of skin: Secondary | ICD-10-CM | POA: Insufficient documentation

## 2017-03-10 DIAGNOSIS — M533 Sacrococcygeal disorders, not elsewhere classified: Secondary | ICD-10-CM | POA: Diagnosis not present

## 2017-03-10 DIAGNOSIS — R200 Anesthesia of skin: Secondary | ICD-10-CM | POA: Insufficient documentation

## 2017-03-10 DIAGNOSIS — G8929 Other chronic pain: Secondary | ICD-10-CM | POA: Insufficient documentation

## 2017-03-10 DIAGNOSIS — M6281 Muscle weakness (generalized): Secondary | ICD-10-CM | POA: Insufficient documentation

## 2017-03-10 NOTE — Patient Instructions (Signed)
Prone Heel Press for strengthening sacro-iliac joints  1. Lie on your belly. If you have an arch in your low back or it feels umcomfortable, place a pillow under your low belly/hips to make sure your low back feel comfortable.   2. Place our forehead on top of your palms.      Widen your knees apart for starting position.   3. Inhale, feel belly and low back expand  4. Exhale, feel belly hug in, press heel together and count aloud for 5 sec. Then relax the heel squeezing.  Perform 10 reps of 5 sec holds. 2 sets/ day.    If you feel entire buttock tighten too much or feel low back pain, apply 50% less effort. As you press your heel together, you will feel as if your pubic bone (front of your pelvis) and sacrum (back of your pelvis) gentle move towards each other or your low abdominal muscles hug in more.         Deep core level 4 modified, slide heel forward and back on exhale

## 2017-03-10 NOTE — Therapy (Signed)
[place] MAIN [facility] SERVICES [address] [place], [place], [address] Phone: [phone]   Fax:  [phone]  Physical Therapy Treatment  Patient Details  Name: Joann Brown MRN: [id number] Date of Birth: [birth year] Referring Provider: Bernadene Bell, MD   Encounter Date: 03/10/2017      PT End of Session - 03/10/17 1008    Visit Number 8   Number of Visits 12   Date for PT Re-Evaluation 05/11/17   Authorization - Visit Number --   PT Start Time 1005   PT Stop Time 1055   PT Time Calculation (min) 50 min   Activity Tolerance Patient tolerated treatment well;No increased pain   Behavior During Therapy WFL for tasks assessed/performed      Past Medical History:  Diagnosis Date  . Anxiety   . Cancer [facility]) 2007    hysterectomy to remove tumor in uterus   . Depression     Past Surgical History:  Procedure Laterality Date  . ABDOMINAL HYSTERECTOMY     2/2 tumor in uterus   . APPENDECTOMY  1986  . BRAIN SURGERY  2014   coil placement for brain aneurysm     There were no vitals filed for this visit.      Subjective Assessment - 03/10/17 1008    Subjective Pt reports the sharp LBP is still there but not as often. The pain in the R low stomach is back. Pt no longer has radiating pain downt he R leg. Pt has numbness in the toes. Pt's hands and arms no longer have tingling and numbness.             [facility] PT Assessment - 03/10/17 1044      Observation/Other Assessments   Observations decreased back mm tensions, more upright posture      Palpation   SI assessment  minor tenderness at R sacrotuberous ligament,  normal arthrokinematics mobility of SIJ, tenderness at pubic tubercle on R                       [facility] Adult PT Treatment/Exercise - 03/10/17 1044      Neuro Re-ed    Neuro Re-ed Details  see pt instructions     Exercises   Exercises --  see pt instructions     Moist Heat Therapy    Number Minutes Moist Heat 6 Minutes   Moist Heat Location --  back, posterior buttocks, pubic bone     Manual Therapy   Manual therapy comments AP mob Grade III hip flex,add,abd,IR,ER on R, MWM at R pubic tubercle                      PT Long Term Goals - 02/16/17 1305      PT LONG TERM GOAL #1   Title Pt will demo no coccyx deviation R and no tenderness / tensions at coccygeus across 2 visits in order to minimize pain and be able to sit / stand for longer periods    Time 12   Period Weeks   Status Achieved     PT LONG TERM GOAL #2   Title Pt will be able to demo decreased abdominal scar restrictions in order to progress to deep core strengthening and minimize pain    Time 12   Period Weeks   Status Achieved     PT LONG TERM GOAL #3   Title Pt will demo increaed hip  flexion from 90 deg to full ROM and no R SIJ hypomobility/ pelvic girdle obliquities in supine in order to progress to being able to tie shoes    Time 12   Period Weeks   Status Achieved     PT LONG TERM GOAL #4   Title Pt will decrease her [place] score from 41% to < 36 % in order to work and lift at work with less LBP and to walk without less abdominal pain   Time 12   Period Weeks   Status On-going     PT LONG TERM GOAL #5   Title Pt will be able to tie her shoes with less pain in her R groin/ low abdominal area in order perform ADLs.   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 Pt no longer has radiating pain down R LE. Addressed her complaint of R LBP and R groin / abdominal pain with manual Tx. Post Tx, pt showed increased AROM and PROM hip flex/ abd/ ER without pain at full range which is an imporvement. Pt advaned with deep core exercises and SIJ stabilization ex. Pt continues to benefit from skilled PT   Rehab Potential Good   PT Frequency 1x / week   PT Duration 12 weeks   PT Treatment/Interventions ADLs/Self Care Home Management;Aquatic Therapy;Neuromuscular re-education;Therapeutic exercise;Balance training;Moist Heat;Traction;Patient/family education;Gait training;Stair training;Scar mobilization;Manual lymph drainage;Manual techniques;Energy conservation;Taping;Functional mobility training;Therapeutic activities   Consulted and Agree with Plan of Care Patient      Patient will benefit from skilled therapeutic intervention in order to improve the following deficits and impairments:  Pain, Improper body mechanics, Decreased mobility, Decreased strength, Postural dysfunction, Decreased scar mobility, Hypomobility, Decreased activity tolerance, Decreased endurance, Decreased range of motion, Decreased coordination, Increased muscle spasms, Decreased safety awareness, Impaired sensation  Visit Diagnosis: Sacrococcygeal disorders, not elsewhere classified  Other abnormalities of gait and mobility  Muscle weakness (generalized)  Chronic right shoulder pain  Chronic left shoulder pain  Numbness and tingling in both hands     Problem List There are no active problems to display for this patient.   Jerl Mina ,PT, DPT, E-RYT  03/10/2017, 10:52 AM  [place] MAIN [facility] SERVICES [address] [place], [place], [address] Phone: [phone]   Fax:  [phone]  Name: Joann Brown MRN: [id number] Date of Birth: 1966-05-02

## 2017-04-14 ENCOUNTER — Ambulatory Visit: Payer: BLUE CROSS/BLUE SHIELD | Attending: Psychiatry | Admitting: Physical Therapy

## 2017-04-14 DIAGNOSIS — R2689 Other abnormalities of gait and mobility: Secondary | ICD-10-CM | POA: Insufficient documentation

## 2017-04-14 DIAGNOSIS — R200 Anesthesia of skin: Secondary | ICD-10-CM | POA: Insufficient documentation

## 2017-04-14 DIAGNOSIS — M533 Sacrococcygeal disorders, not elsewhere classified: Secondary | ICD-10-CM | POA: Insufficient documentation

## 2017-04-14 DIAGNOSIS — G8929 Other chronic pain: Secondary | ICD-10-CM | POA: Diagnosis present

## 2017-04-14 DIAGNOSIS — R202 Paresthesia of skin: Secondary | ICD-10-CM | POA: Diagnosis present

## 2017-04-14 DIAGNOSIS — M6281 Muscle weakness (generalized): Secondary | ICD-10-CM | POA: Insufficient documentation

## 2017-04-14 DIAGNOSIS — M25511 Pain in right shoulder: Secondary | ICD-10-CM | POA: Diagnosis present

## 2017-04-14 DIAGNOSIS — M25512 Pain in left shoulder: Secondary | ICD-10-CM | POA: Diagnosis present

## 2017-04-14 NOTE — Therapy (Signed)
[place] MAIN [facility] SERVICES [address] [place], [place], [address] Phone: [phone]   Fax:  [phone]  Physical Therapy Treatment / Progress Note  Patient Details  Name: Joann Brown MRN: [id number] Date of Birth: 10-Oct-1965 Referring Provider: Bernadene Bell, MD   Encounter Date: 04/14/2017      PT End of Session - 04/14/17 1140    Visit Number 9   Number of Visits 12   Date for PT Re-Evaluation 07/07/17   PT Start Time 9323   PT Stop Time 1155   PT Time Calculation (min) 50 min   Activity Tolerance Patient tolerated treatment well;No increased pain   Behavior During Therapy WFL for tasks assessed/performed      Past Medical History:  Diagnosis Date  . Anxiety   . Cancer [facility]) 2007    hysterectomy to remove tumor in uterus   . Depression     Past Surgical History:  Procedure Laterality Date  . ABDOMINAL HYSTERECTOMY     2/2 tumor in uterus   . APPENDECTOMY  1986  . BRAIN SURGERY  2014   coil placement for brain aneurysm     There were no vitals filed for this visit.      Subjective Assessment - 04/14/17 1108    Subjective Pt reported over the past month,  pt was doing better. But 2 weeks ago, she had pain in the L groin down the front thigh to the foot. Pt went tot he ER and she was told to seek PT because the MD told her it was muscle related.  Pt did not take any medications for the pain but it got better. It gets worse with standing and walking alot.   Pt has not had any more tingling in  her arms and her R hip and abdominal pain is also better             [facility] PT Assessment - 04/14/17 1114      Sensation   Light Touch --  L4 decreased on L > R  ( post Tx: equal sensation)      AROM   Overall AROM  --  no reproduction of radiating pain with lumbar movements      Strength   Overall Strength Comments scaption/ opp glut B: perturbation at trunk and 3/5 strengthe BUE    isolated hip ext:  and abd : B 4/5      Palpation   SI assessment   Standing: PSIS tenderness on R with hip flex R( L SLS)  and anterior R groin pain with L hip flexion ( R SLS).  Hip ext with pain on R PSIS ( post Tx: no pain)      Tenderness and reproduction of radiating pain at L SIJ level of S1.  NO reproduction of pain at lumbar segments.                        [facility] Adult PT Treatment/Exercise - 04/14/17 1139      Neuro Re-ed    Neuro Re-ed Details  see pt instructions     Modalities   Modalities --  buttock 5 min during exercises     Manual Therapy   Manual therapy comments inferior mob, PA mob lateral border, STM along L SIJ                      PT Long Term Goals -  04/14/17 1146      PT LONG TERM GOAL #1   Title Pt will demo no coccyx deviation R and no tenderness / tensions at coccygeus across 2 visits in order to minimize pain and be able to sit / stand for longer periods    Time 12   Period Weeks   Status Achieved     PT LONG TERM GOAL #2   Title Pt will be able to demo decreased abdominal scar restrictions in order to progress to deep core strengthening and minimize pain    Time 12   Period Weeks   Status Achieved     PT LONG TERM GOAL #3   Title Pt will demo increaed hip flexion from 90 deg to full ROM and no R SIJ hypomobility/ pelvic girdle obliquities in supine in order to progress to being able to tie shoes    Time 12   Period Weeks   Status Achieved     PT LONG TERM GOAL #4   Title Pt will decrease her [place] score from 41% to < 36 % in order to work and lift at work with less LBP and to walk without less abdominal pain ( 10/25: 23%)     Time 12   Period Weeks   Status Achieved     PT LONG TERM GOAL #5   Title Pt will be able to tie her shoes with less pain in her R groin/ low abdominal area in order perform ADLs.   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 Pt's Pain Disability Index score decreased from 41% to 23% across the past 9 visits. Pt 's R abdominal and pelvic pain in addition to her BUE numbness and tingling have all resolved.  She is able to bend her hip to tie her shoes. Pt has showed improved deep core strength, pelvic stability, and significantly decreased pelvic floor/ spinal mm tensions, and increased hip / SIJ mobility.     However,  pt returns to PT after 1 month and reported she went to the ER due to L radiating pain to foot. Pt was told by the doctor her issue was due to muscles and recommedne her to PT. Today, pt reported radiating pain down L leg with decreased sensation along L4 dermatome. Ruled out lumbar radiculopathy. Pt had hypomobility in SIJ on L with reproduction of radiating  pain st S1 level along L SIJ and reproduction of pain with single limb stance. Following Tx, pt reported no radiating pain on LLE and equal sensation was restored.  Pt had no pain with single stance which indicate improved pelvic girdle stability. Pt was advanced to thoraacolumbar strengthening with yellow latex band which pt reported no complaints.  Pt 's job requires repeated lifting, pulling, and loaded activities.  Therefore, pt continues to benefit from skilled PT to build up strength and minimize relapse of Sx.        Rehab Potential Good   PT Frequency 1x / week   PT Duration 12 weeks   PT Treatment/Interventions ADLs/Self Care Home Management;Aquatic Therapy;Neuromuscular re-education;Therapeutic exercise;Balance training;Moist Heat;Traction;Patient/family education;Gait training;Stair training;Scar mobilization;Manual lymph drainage;Manual techniques;Energy conservation;Taping;Functional mobility training;Therapeutic activities   Consulted and Agree with Plan of Care Patient      Patient will benefit from skilled therapeutic intervention in order to improve the following deficits and impairments:  Pain, Improper body mechanics, Decreased mobility, Decreased strength, Postural dysfunction, Decreased scar mobility, Hypomobility, Decreased activity tolerance, Decreased endurance, Decreased range of motion, Decreased coordination, Increased muscle spasms, Decreased safety awareness, Impaired sensation  Visit Diagnosis: Sacrococcygeal disorders, not elsewhere classified  Other abnormalities of gait and mobility  Muscle weakness (generalized)  Chronic left shoulder pain  Chronic right shoulder pain  Numbness and tingling in both hands     Problem List There are no active problems to display for this patient.   Jerl Mina ,PT, DPT, E-RYT  04/14/2017, 12:04 PM  [place] MAIN [facility] SERVICES [address] [place], [place], [address] Phone: [phone]   Fax:  [phone]  Name: Aika Brzoska MRN: [id number] Date of Birth: Jun 01, 1966

## 2017-04-14 NOTE — Patient Instructions (Signed)
Bridging series w/ resistive band other side of doorknob:  Level 1:  Position:  Elbows bent, knees hip width apart, heels under knees   Stabilization points: shoulders, upper arms, back of head pressed into floor. Heel press downward.   Movement: inhale do nothing, exhale pull band by side, lower fists to floor completely while lifting hips.Keep stabilization points engaged when you allow the band to go back to starting position  10 x 2 reps       Level 2:  Position:  Elbows straight, arms raised to ceiling at shoulder height, knees apart like a ballerina,heels together, heels under knees, shinbone perpendicular to ground   Stabilization points: shoulders, upper arms, back of head pressed into floor. Heel press downward.   Movement: inhale do nothing, exhale pull band by side, lower fists to floor completely while lifting hips. Keep stabilization points engaged when you allow the band to go back to starting position   10 x 2 reps  Shoulder training: Try to imagine you are squeezing a pencil under your armpit and your shoulder blades are down away from your ears and towards each other     _____

## 2017-04-28 ENCOUNTER — Ambulatory Visit: Payer: BLUE CROSS/BLUE SHIELD | Attending: Psychiatry | Admitting: Physical Therapy

## 2017-05-17 ENCOUNTER — Ambulatory Visit: Payer: BLUE CROSS/BLUE SHIELD | Admitting: Physical Therapy

## 2017-05-23 ENCOUNTER — Encounter: Payer: BLUE CROSS/BLUE SHIELD | Admitting: Physical Therapy

## 2017-06-08 ENCOUNTER — Encounter: Payer: BLUE CROSS/BLUE SHIELD | Admitting: Physical Therapy

## 2017-06-09 ENCOUNTER — Ambulatory Visit: Payer: BLUE CROSS/BLUE SHIELD | Attending: Psychiatry | Admitting: Physical Therapy

## 2017-06-09 DIAGNOSIS — M6281 Muscle weakness (generalized): Secondary | ICD-10-CM | POA: Diagnosis present

## 2017-06-09 DIAGNOSIS — M533 Sacrococcygeal disorders, not elsewhere classified: Secondary | ICD-10-CM | POA: Diagnosis not present

## 2017-06-09 DIAGNOSIS — R2689 Other abnormalities of gait and mobility: Secondary | ICD-10-CM | POA: Diagnosis present

## 2017-06-09 DIAGNOSIS — R200 Anesthesia of skin: Secondary | ICD-10-CM | POA: Insufficient documentation

## 2017-06-09 DIAGNOSIS — M25512 Pain in left shoulder: Secondary | ICD-10-CM | POA: Insufficient documentation

## 2017-06-09 DIAGNOSIS — G8929 Other chronic pain: Secondary | ICD-10-CM | POA: Diagnosis present

## 2017-06-09 DIAGNOSIS — M25511 Pain in right shoulder: Secondary | ICD-10-CM | POA: Diagnosis present

## 2017-06-09 DIAGNOSIS — R202 Paresthesia of skin: Secondary | ICD-10-CM | POA: Insufficient documentation

## 2017-06-09 NOTE — Patient Instructions (Signed)
Strap stretches:

## 2017-06-10 NOTE — Therapy (Deleted)
[place] MAIN [facility] SERVICES [address] [place], [place], [address] Phone: [phone]   Fax:  [phone]  Physical Therapy Treatment  Patient Details  Name: Joann Brown MRN: [id number] Date of Birth: [birth year] Referring Provider: Bernadene Bell, MD    Encounter Date: 06/09/2017    Past Medical History:  Diagnosis Date  . Anxiety   . Cancer [facility]) 2007    hysterectomy to remove tumor in uterus   . Depression     *** The histories are not reviewed yet. Please review them in the "History" navigator section and refresh this [place].  There were no vitals filed for this visit.                   [facility] Adult PT Treatment/Exercise - 06/10/17 2314      Neuro Re-ed    Neuro Re-ed Details   see pt instructions      Manual Therapy   Manual therapy comments  STM along lumbar R QL, SIJ B                  PT Long Term Goals - 04/14/17 1146      PT LONG TERM GOAL #1   Title  Pt will demo no coccyx deviation R and no tenderness / tensions at coccygeus across 2 visits in order to minimize pain and be able to sit / stand for longer periods     Time  12    Period  Weeks    Status  Achieved      PT LONG TERM GOAL #2   Title  Pt will be able to demo decreased abdominal scar restrictions in order to progress to deep core strengthening and minimize pain     Time  12    Period  Weeks    Status  Achieved      PT LONG TERM GOAL #3   Title  Pt will demo increaed hip flexion from 90 deg to full ROM and no R SIJ hypomobility/ pelvic girdle obliquities in supine in order to progress to being able to tie shoes     Time  12    Period  Weeks    Status  Achieved      PT LONG TERM GOAL #4   Title  Pt will decrease her [place] score from 41% to < 36 % in order to work and lift at work with less LBP and to walk without less abdominal pain ( 10/25: 23%)      Time  12    Period  Weeks    Status  Achieved      PT LONG TERM GOAL #5   Title  Pt will be able to tie her shoes with less pain in her R groin/ low abdominal area in order perform ADLs.    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    Time  32    Period  Weeks    Status  Achieved      PT LONG TERM GOAL #7   Title  Pt will demo less forward head posture  in order to minimize shoulder pain to perform ADLs    Time  12    Period  Weeks    Status  Achieved      PT LONG TERM GOAL #8   Title  Pt will be able to demo 20 reps with red band with thoracolumbar strengthening exercises and no lumbopelvic perturbation with scaption/ opposite hip ext in order to maintain strength with lfiting at work and gradbaby    Time  8    Period  Weeks    Status  New            Plan - 06/10/17 2315    Clinical Impression Statement  Pt returns with complaint today of low back pain with radiculopathy down BLE. Pt showed good carry over with hip ROM, pelvic alignment, and spinal mobility except for increased lumbar tensions. Following manual Tx and self-correction/ therapeutic exercises, pt 's pain centralized, her slump test became negative, and sensation along BLE dermatomes returned.  Pt performing increased lifting and carrying of her new grandbaby and therefore she will benefit from further strength training and flexibility training to minimize relapse of Sx.       Rehab Potential  Good    PT Frequency  1x / week    PT Duration  12 weeks    PT Treatment/Interventions  ADLs/Self Care Home Management;Aquatic Therapy;Neuromuscular re-education;Therapeutic exercise;Balance training;Moist Heat;Traction;Patient/family education;Gait training;Stair training;Scar mobilization;Manual lymph drainage;Manual techniques;Energy  conservation;Taping;Functional mobility training;Therapeutic activities    Consulted and Agree with Plan of Care  Patient       Patient will benefit from skilled therapeutic intervention in order to improve the following deficits and impairments:  Pain, Improper body mechanics, Decreased mobility, Decreased strength, Postural dysfunction, Decreased scar mobility, Hypomobility, Decreased activity tolerance, Decreased endurance, Decreased range of motion, Decreased coordination, Increased muscle spasms, Decreased safety awareness, Impaired sensation  Visit Diagnosis: Sacrococcygeal disorders, not elsewhere classified  Other abnormalities of gait and mobility  Muscle weakness (generalized)  Chronic left shoulder pain  Chronic right shoulder pain  Numbness and tingling in both hands     Problem List There are no active problems to display for this patient.   Jerl Mina 06/10/2017, 11:25 PM  [place] MAIN [facility] SERVICES [address] [place], [place], [address] Phone: [phone]   Fax:  [phone]  Name: Joann Brown MRN: [id number] Date of Birth: 1965-08-07

## 2017-06-10 NOTE — Therapy (Signed)
[place] MAIN [facility] SERVICES [address] [place], [place], [address] Phone: [phone]   Fax:  [phone]  Physical Therapy Treatment / Progress Note  Patient Details  Name: Joann Brown MRN: [id number] Date of Birth: [birth year] Referring Provider: Bernadene Bell, MD    Encounter Date: 06/09/2017    Past Medical History:  Diagnosis Date  . Anxiety   . 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 Depression      Subjective Assessment - 06/10/17 2325    Subjective  Pt has had more than 2 months with less pain. But recently pt noticed she had radiating pain down B legs last week. Pt had been moving furniture. Pt has been carrying her grandson. Pt also reported she has not been doing her exercises as much. Today, pt reports pain is at 8-9/10 that is located at sacral area and down B legs to bottom of mid feet.           [facility] PT Assessment - 06/10/17 2325      Observation/Other Assessments   Observations  + slump test B, decreased sensation on R for all LE dermatome. DTR reflex: knee jerk 1+ B, achilles R -1, L 0.   post Tx: neg slump test, equal sensation L3-S1 at B leg but decreased sensation over R thigh dermatomes                    [facility] Adult PT Treatment/Exercise - 06/10/17 2314      Neuro Re-ed    Neuro Re-ed Details   see pt instructions      Manual Therapy   Manual therapy comments  STM along lumbar R QL, SIJ B               PT Long Term Goals - 06/10/17 2327      PT LONG TERM GOAL #1   Title  Pt will demo no coccyx deviation R and no tenderness / tensions at coccygeus across 2 visits in order to minimize pain and be able to sit / stand for longer periods     Time  12    Period  Weeks    Status  Achieved      PT LONG TERM GOAL #2   Title  Pt will be able to demo decreased abdominal scar restrictions in order to progress to deep core strengthening and minimize pain      Time  12    Period  Weeks    Status  Achieved      PT LONG TERM GOAL #3   Title  Pt will demo increaed hip flexion from 90 deg to full ROM and no R SIJ hypomobility/ pelvic girdle obliquities in supine in order to progress to being able to tie shoes     Time  12    Period  Weeks    Status  Achieved      PT LONG TERM GOAL #4   Title  Pt will decrease her [place] score from 41% to < 36 % in order to work and lift at work with less LBP and to walk without less abdominal pain ( 10/25: 23%)      Time  12    Period  Weeks    Status  Achieved      PT LONG TERM GOAL #5   Title  Pt will be able to tie her shoes with less  pain in her R groin/ low abdominal area in order perform ADLs.    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    Time  6    Period  Weeks    Status  Achieved      PT LONG TERM GOAL #7   Title  Pt will demo less forward head posture in order to minimize shoulder pain to perform ADLs    Time  12    Period  Weeks    Status  Achieved      PT LONG TERM GOAL #8   Title  Pt will be able to demo 20 reps with red band with thoracolumbar strengthening exercises and no lumbopelvic perturbation with scaption/ opposite hip ext in order to maintain strength with lfiting at work and [place]    Time  8    Period  Weeks    Status On going     PT LONG TERM GOAL  #9   TITLE  Pt will report no radiating pain down BLE or single LE across 2 months in order to carry and care for her grandson while performing lifting at work    Time  12    Period  Weeks    Status  New    Target Date  09/02/17            Plan - 06/10/17 2315    Clinical Impression Statement Pt has achieved 7/9 goals. Pt returns today after 2 months with complaint of low back pain with  radiculopathy down BLE. Pt showed good carry over with hip ROM, pelvic alignment,deep core strength, and spinal mobility. However, she demo'd increased lumbar tensions which was associated with her radicular Sx. Following manual Tx and self-correction/ therapeutic exercises today, pt 's pain centralized, her slump test became negative, and sensation along BLE dermatomes returned.  Pt is performing increased lifting and carrying of her new grandbaby and therefore she will benefit from more skilled PT. Pt learned body mechanics with lifting baby. She will benefit from further strength training and flexibility training to minimize relapse of Sx.      Rehab Potential  Good    PT Frequency  1x / week    PT Duration  12 weeks    PT Treatment/Interventions  ADLs/Self Care Home Management;Aquatic Therapy;Neuromuscular re-education;Therapeutic exercise;Balance training;Moist Heat;Traction;Patient/family education;Gait training;Stair training;Scar mobilization;Manual lymph drainage;Manual techniques;Energy conservation;Taping;Functional mobility training;Therapeutic activities    Consulted and Agree with Plan of Care  Patient       Patient will benefit from skilled therapeutic intervention in order to improve the following deficits and impairments:  Pain, Improper body mechanics, Decreased mobility, Decreased strength, Postural dysfunction, Decreased scar mobility, Hypomobility, Decreased activity tolerance, Decreased endurance, Decreased range of motion, Decreased coordination, Increased muscle spasms, Decreased safety awareness, Impaired sensation  Visit Diagnosis: Sacrococcygeal disorders, not elsewhere classified  Other abnormalities of gait and mobility  Muscle weakness (generalized)  Chronic left shoulder pain  Chronic right shoulder pain  Numbness and tingling in both hands     Problem List There are no active problems to display for this patient.   Jerl Mina ,PT, DPT,  E-RYT  06/10/2017, 11:29 PM  [place] MAIN [facility] SERVICES [address] [place], [place], [address] Phone: [phone]   Fax:  [phone]  Name: Joann Brown MRN: [id number] Date of Birth: 04/29/1966

## 2017-07-08 ENCOUNTER — Ambulatory Visit: Payer: BLUE CROSS/BLUE SHIELD | Attending: Psychiatry | Admitting: Physical Therapy

## 2017-07-08 DIAGNOSIS — M25512 Pain in left shoulder: Secondary | ICD-10-CM | POA: Diagnosis present

## 2017-07-08 DIAGNOSIS — M25511 Pain in right shoulder: Secondary | ICD-10-CM | POA: Diagnosis present

## 2017-07-08 DIAGNOSIS — R202 Paresthesia of skin: Secondary | ICD-10-CM | POA: Diagnosis present

## 2017-07-08 DIAGNOSIS — R200 Anesthesia of skin: Secondary | ICD-10-CM | POA: Diagnosis present

## 2017-07-08 DIAGNOSIS — R2689 Other abnormalities of gait and mobility: Secondary | ICD-10-CM | POA: Diagnosis present

## 2017-07-08 DIAGNOSIS — M533 Sacrococcygeal disorders, not elsewhere classified: Secondary | ICD-10-CM | POA: Insufficient documentation

## 2017-07-08 DIAGNOSIS — G8929 Other chronic pain: Secondary | ICD-10-CM | POA: Diagnosis present

## 2017-07-08 DIAGNOSIS — M6281 Muscle weakness (generalized): Secondary | ICD-10-CM | POA: Diagnosis present

## 2017-07-08 NOTE — Patient Instructions (Signed)
Prone Heel Press for strengthening sacro-iliac joints  1. Lie on your belly. If you have an arch in your low back or it feels umcomfortable, place a pillow under your low belly/hips to make sure your low back feel comfortable.   2. Place our forehead on top of your palms.      Widen your knees apart for starting position.   3. Inhale, feel belly and low back expand  4. Exhale, feel belly hug in, press heel together and count aloud for 5 sec. Then relax the heel squeezing.  Perform 10 reps of 5 sec holds. 2 sets/ day.    If you feel entire buttock tighten too much or feel low back pain, apply 50% less effort. As you press your heel together, you will feel as if your pubic bone (front of your pelvis) and sacrum (back of your pelvis) gentle move towards each other or your low abdominal muscles hug in more.         Frog (stretch) dropping ankles out 45 deg   10 reps each   _____________   R side bend 30 reps   3 x day   _____________

## 2017-07-08 NOTE — Therapy (Signed)
[place] MAIN [facility] SERVICES [address] [place], [place], [address] Phone: [phone]   Fax:  [phone]  Physical Therapy Treatment  Patient Details  Name: Joann Brown MRN: [id number] Date of Birth: 03-May-1966 Referring Provider: Bernadene Bell, MD    Encounter Date: 07/08/2017  PT End of Session - 07/08/17 1203    Visit Number  11    Date for PT Re-Evaluation  09/01/17    PT Start Time  1030    PT Stop Time  1130    PT Time Calculation (min)  60 min    Activity Tolerance  Patient tolerated treatment well;No increased pain    Behavior During Therapy  WFL for tasks assessed/performed       Past Medical History:  Diagnosis Date  . Anxiety   . Cancer [facility]) 2007    hysterectomy to remove tumor in uterus   . Depression       Subjective Assessment - 07/08/17 1033    Subjective  Pt reports her sciatic Sx has improved by 70%.  Pt does not have the numbness / tingling anymore. Pt does notice that she feels as if her feet are splayed out or as if her ankle is twisted when walking or standing. Pt points to her sacrum and down back of L thigh and states the ankle / feet sensation  feels like it is coming from this area. Pt reported she noticed numbness in the vaginal area but no loss of control with urination / or bowel .           [facility] PT Assessment - 07/08/17 1039      Observation/Other Assessments   Observations  decreased sensation on L LE at L4/5 dermatome only.          Strength   Overall Strength Comments  L knee flex/ext 4-/5 , R 5/5 . Pain with plantarflexion in sitting, L 4-/5 (post Tx no pain), R 5/5.  hip ext L 3/5 pain (postTx: 4/5) , 4-/5 R    Plantarflexion R 4/5 (13 reps), L 2/5 ( 0 reps) due to pain       Palpation   SI assessment   L PSIS slightly posterior, tightness at coccgeus L, limited counternutation at L SIJ ( improved postTx)                    [facility] Adult PT Treatment/Exercise -  07/08/17 1156      Neuro Re-ed    Neuro Re-ed Details   see pt instructions      Manual Therapy   Manual therapy comments  PA mob L SIJ Grade II, STM L coccgeus in prone,  MWM with pressure at ischial tuberosity medial aspect L     Internal Pelvic Floor  STM at L priformis, obt int, iliococcgeus              PT Education - 07/08/17 1209    Education provided  Yes    Education Details  HEP    Person(s) Educated  Patient    Methods  Explanation;Demonstration;Tactile cues;Verbal cues;Handout    Comprehension  Returned demonstration;Verbalized understanding          PT Long Term Goals - 06/10/17 2327      PT LONG TERM GOAL #1   Title  Pt will demo no coccyx deviation R and no tenderness / tensions at coccygeus across 2 visits in order to minimize pain and be able to sit /  stand for longer periods     Time  12    Period  Weeks    Status  Achieved      PT LONG TERM GOAL #2   Title  Pt will be able to demo decreased abdominal scar restrictions in order to progress to deep core strengthening and minimize pain     Time  12    Period  Weeks    Status  Achieved      PT LONG TERM GOAL #3   Title  Pt will demo increaed hip flexion from 90 deg to full ROM and no R SIJ hypomobility/ pelvic girdle obliquities in supine in order to progress to being able to tie shoes     Time  12    Period  Weeks    Status  Achieved      PT LONG TERM GOAL #4   Title  Pt will decrease her [place] score from 41% to < 36 % in order to work and lift at work with less LBP and to walk without less abdominal pain ( 10/25: 23%)      Time  12    Period  Weeks    Status  Achieved      PT LONG TERM GOAL #5   Title  Pt will be able to tie her shoes with less pain in her R groin/ low abdominal area in order perform ADLs.    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    Time  72    Period  Weeks    Status  Achieved      PT LONG TERM GOAL #7   Title  Pt will demo less forward head posture in order to minimize shoulder pain to perform ADLs    Time  12    Period  Weeks    Status  Achieved      PT LONG TERM GOAL #8   Title  Pt will be able to demo 20 reps with red band with thoracolumbar strengthening exercises and no lumbopelvic perturbation with scaption/ opposite hip ext in order to maintain strength with lfiting at work and [place]    Time  8    Period  Weeks    Status  New      PT LONG TERM GOAL  #9   TITLE  Pt will report no radiating pain down BLE or single LE across 2 months in order to carry and care for her grandson while performing lifting at work    Time  12    Period  Weeks    Status  New    Target Date  09/02/17            Plan - 07/08/17 1210    Clinical Impression Statement Pt returns after 1 month since her last session. Pt showed good carry over from last session as pt reported no bilateral radiating pain.  Pt 's remaining issue reported today was feeling weakness in her ankles, a source of pain from sacrum down to L posterior thigh, and numbness in pelvic area .  Denied loss of bowel/urine. Prior  to Tx, pt showed normal deep tendon reflex, decreased dermatome on L at L4-5, pain with open chain plantarflexion, and inability to perform standing plantarflexion on LLE. External manual Tx  addressed slightly posterior L PSIS and coccgyeus mm tightness with tenderness/ fascial restrictions at medial aspect of ischial tuberosity. Internal Tx addressed severe tightness of piriformis and iliococcgeus . Pt tolerated today's Tx.  Following Tx, pt was able to perform Grade 4/5 plantarflexion for 13 reps similar to strength of RLE, bilateral sensation along dermatome on BLE, increased L hip extension strength. Pt reported feeling much better post Tx.  Pt  continues to benefit from skilled PT.      Rehab Potential  Good    PT Frequency  1x / week    PT Duration  12 weeks    PT Treatment/Interventions  ADLs/Self Care Home Management;Aquatic Therapy;Neuromuscular re-education;Therapeutic exercise;Balance training;Moist Heat;Traction;Patient/family education;Gait training;Stair training;Scar mobilization;Manual lymph drainage;Manual techniques;Energy conservation;Taping;Functional mobility training;Therapeutic activities    Consulted and Agree with Plan of Care  Patient       Patient will benefit from skilled therapeutic intervention in order to improve the following deficits and impairments:  Pain, Improper body mechanics, Decreased mobility, Decreased strength, Postural dysfunction, Decreased scar mobility, Hypomobility, Decreased activity tolerance, Decreased endurance, Decreased range of motion, Decreased coordination, Increased muscle spasms, Decreased safety awareness, Impaired sensation  Visit Diagnosis: Sacrococcygeal disorders, not elsewhere classified  Other abnormalities of gait and mobility  Chronic left shoulder pain  Chronic right shoulder pain  Numbness and tingling in both hands  Muscle weakness (generalized)     Problem List There are no active problems to display for this patient.   Jerl Mina ,PT, DPT, E-RYT  07/08/2017, 12:17 PM  [place] MAIN [facility] SERVICES [address] [place], [place], [address] Phone: [phone]   Fax:  [phone]  Name: Joann Brown MRN: [id number] Date of Birth: 12/16/1965

## 2017-07-11 ENCOUNTER — Ambulatory Visit: Payer: BLUE CROSS/BLUE SHIELD | Admitting: Physical Therapy

## 2017-07-11 DIAGNOSIS — R200 Anesthesia of skin: Secondary | ICD-10-CM

## 2017-07-11 DIAGNOSIS — R2689 Other abnormalities of gait and mobility: Secondary | ICD-10-CM

## 2017-07-11 DIAGNOSIS — M533 Sacrococcygeal disorders, not elsewhere classified: Secondary | ICD-10-CM

## 2017-07-11 DIAGNOSIS — M25511 Pain in right shoulder: Secondary | ICD-10-CM

## 2017-07-11 DIAGNOSIS — R202 Paresthesia of skin: Secondary | ICD-10-CM

## 2017-07-11 DIAGNOSIS — M25512 Pain in left shoulder: Secondary | ICD-10-CM

## 2017-07-11 DIAGNOSIS — M6281 Muscle weakness (generalized): Secondary | ICD-10-CM

## 2017-07-11 DIAGNOSIS — G8929 Other chronic pain: Secondary | ICD-10-CM

## 2017-07-11 NOTE — Patient Instructions (Signed)
To correct the misalignment of the L sacroiliac joint:   Wall pelvic tilts after putting grandson in/ out car seat  Alternating sides with holding him and carseat transfers     To minimize the R upper low back tightness:   Table top and R hand under and open twist to the R   Or standing      5reps    To strengthen gluts: Table top and bird dog with grandson under    5 reps each      Body mechanics:  Transfer grandson from floor to couch then standing

## 2017-07-12 NOTE — Therapy (Signed)
[place] MAIN [facility] SERVICES [address] [place], [place], [address] Phone: [phone]   Fax:  [phone]  Physical Therapy Treatment  Patient Details  Name: Joann Brown MRN: [id number] Date of Birth: 12-Jun-1966 Referring Provider: Bernadene Bell, MD    Encounter Date: 07/11/2017  PT End of Session - 07/12/17 0907    Visit Number  12    Date for PT Re-Evaluation  09/01/17    PT Start Time  1200    PT Stop Time  1300    PT Time Calculation (min)  60 min    Activity Tolerance  Patient tolerated treatment well;No increased pain    Behavior During Therapy  WFL for tasks assessed/performed       Past Medical History:  Diagnosis Date  . Anxiety   . Cancer [facility]) 2007    hysterectomy to remove tumor in uterus   . Depression      Subjective Assessment - 07/11/17 1205    Subjective  Pt reported the weakness in her ankle and foot on L and pain in her sacrum have improved by 90% since last . Pt reports she only feels burning over the top of her R foot which started yesterday          [facility] PT Assessment - 07/12/17 0906      Observation/Other Assessments   Observations  decreased sensation on L at L5 thigh and leg       Strength   Overall Strength Comments  standing plantflexion 10 rep on R 5/5 , 17 reps on L 5/5 no pain. knee flexin/ext hip flexion B 5/5. hip ext L 4-/5, R 5/5        Palpation   SI assessment   pain at L SIJ , no PSIS malalignment     Palpation comment  increased tightness and tenderness at 12th rib R and referred to L SIJ  ( decreased post Tx)                   [facility] Adult PT Treatment/Exercise - 07/12/17 0906      Neuro Re-ed    Neuro Re-ed Details   see pt instructions      Manual Therapy   Manual therapy comments  PA mob L SIJ Grade II,  STM along posterior 12th rib and paraspinal/ QL R               PT Education - 07/12/17 0907    Education provided  Yes    Education  Details  HEP    Person(s) Educated  Patient    Methods  Explanation;Demonstration;Verbal cues;Handout;Tactile cues    Comprehension  Verbalized understanding;Returned demonstration          PT Long Term Goals - 06/10/17 2327      PT LONG TERM GOAL #1   Title  Pt will demo no coccyx deviation R and no tenderness / tensions at coccygeus across 2 visits in order to minimize pain and be able to sit / stand for longer periods     Time  12    Period  Weeks    Status  Achieved      PT LONG TERM GOAL #2   Title  Pt will be able to demo decreased abdominal scar restrictions in order to progress to deep core strengthening and minimize pain     Time  12    Period  Weeks    Status  Achieved  PT LONG TERM GOAL #3   Title  Pt will demo increaed hip flexion from 90 deg to full ROM and no R SIJ hypomobility/ pelvic girdle obliquities in supine in order to progress to being able to tie shoes     Time  12    Period  Weeks    Status  Achieved      PT LONG TERM GOAL #4   Title  Pt will decrease her [place] score from 41% to < 36 % in order to work and lift at work with less LBP and to walk without less abdominal pain ( 10/25: 23%)      Time  12    Period  Weeks    Status  Achieved      PT LONG TERM GOAL #5   Title  Pt will be able to tie her shoes with less pain in her R groin/ low abdominal area in order perform ADLs.    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    Time  95    Period  Weeks    Status  Achieved      PT LONG TERM GOAL #7   Title  Pt will demo less forward head posture in order to minimize shoulder pain to perform ADLs    Time  12    Period  Weeks    Status  Achieved      PT LONG TERM GOAL #8   Title  Pt will be able to demo 20 reps with red band  with thoracolumbar strengthening exercises and no lumbopelvic perturbation with scaption/ opposite hip ext in order to maintain strength with lfiting at work and [place]    Time  8    Period  Weeks    Status  New      PT LONG TERM GOAL  #9   TITLE  Pt will report no radiating pain down BLE or single LE across 2 months in order to carry and care for her grandson while performing lifting at work    Time  12    Period  Weeks    Status  New    Target Date  09/02/17            Plan - 07/12/17 0908    Clinical Impression Statement  Pt showed good carry over from last session as pt no longer showed antalgic gait and improved sensation today.   L5 dermatome on L only remains decreased. However, pt reports burning over the dorsum of R foot which may likely be related to lumbar area which had mm tightness at T12 and upper L.  Following Tx to decrease the tightness in this area and to improve L SIJ mobility, pt reported less tenderness in these areas. Discussed body mechanics with pt on transfer grandson in car seat or from floor to standing to minimize mm imbalance.  Suspect pt' s method of getting her grandson into [place] with car seat was likely a contributing factor to her pelvic obliquities.  Pt was educated to also hold baby in her R arms but lean against the wall for support when  she feels her R side is weaker than L. Pt demo'd proper technique. Pt tolerated today's Tx without complaints. Pt continues to benefits from skilled PT.    Rehab Potential  Good    PT Frequency  1x / week    PT Duration  12 weeks    PT Treatment/Interventions  ADLs/Self Care Home Management;Aquatic Therapy;Neuromuscular re-education;Therapeutic exercise;Balance training;Moist Heat;Traction;Patient/family education;Gait training;Stair training;Scar mobilization;Manual lymph drainage;Manual techniques;Energy conservation;Taping;Functional mobility training;Therapeutic activities    Consulted and Agree with Plan of Care   Patient       Patient will benefit from skilled therapeutic intervention in order to improve the following deficits and impairments:  Pain, Improper body mechanics, Decreased mobility, Decreased strength, Postural dysfunction, Decreased scar mobility, Hypomobility, Decreased activity tolerance, Decreased endurance, Decreased range of motion, Decreased coordination, Increased muscle spasms, Decreased safety awareness, Impaired sensation  Visit Diagnosis: Sacrococcygeal disorders, not elsewhere classified  Other abnormalities of gait and mobility  Chronic left shoulder pain  Chronic right shoulder pain  Numbness and tingling in both hands  Muscle weakness (generalized)     Problem List There are no active problems to display for this patient.   Jerl Mina ,PT, DPT, E-RYT  07/12/2017, 9:22 AM  [place] MAIN [facility] SERVICES [address] [place], [place], [address] Phone: [phone]   Fax:  [phone]  Name: Joann Brown MRN: [id number] Date of Birth: [birth year]

## 2017-07-22 ENCOUNTER — Ambulatory Visit: Payer: BLUE CROSS/BLUE SHIELD | Attending: Psychiatry | Admitting: Physical Therapy

## 2017-07-22 DIAGNOSIS — M533 Sacrococcygeal disorders, not elsewhere classified: Secondary | ICD-10-CM | POA: Insufficient documentation

## 2017-07-22 DIAGNOSIS — R2689 Other abnormalities of gait and mobility: Secondary | ICD-10-CM | POA: Diagnosis present

## 2017-07-22 DIAGNOSIS — M25512 Pain in left shoulder: Secondary | ICD-10-CM | POA: Diagnosis present

## 2017-07-22 DIAGNOSIS — G8929 Other chronic pain: Secondary | ICD-10-CM | POA: Insufficient documentation

## 2017-07-22 DIAGNOSIS — R202 Paresthesia of skin: Secondary | ICD-10-CM | POA: Insufficient documentation

## 2017-07-22 DIAGNOSIS — M25511 Pain in right shoulder: Secondary | ICD-10-CM | POA: Insufficient documentation

## 2017-07-22 DIAGNOSIS — R200 Anesthesia of skin: Secondary | ICD-10-CM | POA: Insufficient documentation

## 2017-07-22 DIAGNOSIS — M6281 Muscle weakness (generalized): Secondary | ICD-10-CM | POA: Diagnosis present

## 2017-07-22 NOTE — Patient Instructions (Signed)
Forward bend at counter with knees slightly bent, lengthen back and then rotate trunk to look under L armpit  Then place L hand on hips to rotate more      _________  "Drawing a sword" "    Band under both feet, L hand holds end of the band that wraps the outside of R thigh Keep thigh aligned with toes and not let them move inward,  Keep elbow by your ribs  10 x 2 reps  X 2

## 2017-07-23 NOTE — Therapy (Addendum)
[place] MAIN [facility] SERVICES [address] [place], [place], [address] Phone: [phone]   Fax:  [phone]  Physical Therapy Treatment  Patient Details  Name: Joann Brown MRN: [id number] Date of Birth: July 24, 1965 Referring Provider: Bernadene Bell, MD    Encounter Date: 07/22/2017  PT End of Session - 07/23/17 1527    Visit Number  13    Date for PT Re-Evaluation  09/01/17    PT Start Time  2637    PT Stop Time  1130    PT Time Calculation (min)  45 min    Activity Tolerance  Patient tolerated treatment well;No increased pain    Behavior During Therapy  WFL for tasks assessed/performed       Past Medical History:  Diagnosis Date  . Anxiety   . Cancer [facility]) 2007    hysterectomy to remove tumor in uterus   . Depression      There were no vitals filed for this visit.  Subjective Assessment - 07/22/17 1052    Subjective Pt is able to put weight on L foot with pain and can walk more and do the exercise. The top of R foot is no longer numb.  She feels pain down her L lateral thigh from her back.         [facility] PT Assessment - 07/23/17 1527      Palpation   Spinal mobility  reproduction of L lateral thigh pain w/ palpation at T10-L2 L interspinal tightness/ posterior intercostal limited mobility  noted.     SI assessment   levelled      Dermatome: decreased sensation at L5 on LLE             [facility] Adult PT Treatment/Exercise - 07/23/17 1527      Neuro Re-ed    Neuro Re-ed Details   see pt instructions      Modalities   Modalities  -- back  5 min during exercises      Manual Therapy   Manual therapy comments  medial mobs at T10-L2 L aspect , STM interspinals with MWM , and at intercostals                  PT Long Term Goals - 06/10/17 2327      PT LONG TERM GOAL #1   Title  Pt will demo no coccyx deviation R and no tenderness / tensions at coccygeus across 2 visits in order to minimize  pain and be able to sit / stand for longer periods     Time  12    Period  Weeks    Status  Achieved      PT LONG TERM GOAL #2   Title  Pt will be able to demo decreased abdominal scar restrictions in order to progress to deep core strengthening and minimize pain     Time  12    Period  Weeks    Status  Achieved      PT LONG TERM GOAL #3   Title  Pt will demo increaed hip flexion from 90 deg to full ROM and no R SIJ hypomobility/ pelvic girdle obliquities in supine in order to progress to being able to tie shoes     Time  12    Period  Weeks    Status  Achieved      PT LONG TERM GOAL #4   Title  Pt will decrease her PDI score from 41%  to < 36 % in order to work and lift at work with less LBP and to walk without less abdominal pain ( 10/25: 23%)      Time  12    Period  Weeks    Status  Achieved      PT LONG TERM GOAL #5   Title  Pt will be able to tie her shoes with less pain in her R groin/ low abdominal area in order perform ADLs.    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    Time  74    Period  Weeks    Status  Achieved      PT LONG TERM GOAL #7   Title  Pt will demo less forward head posture in order to minimize shoulder pain to perform ADLs    Time  12    Period  Weeks    Status  Achieved      PT LONG TERM GOAL #8   Title  Pt will be able to demo 20 reps with red band with thoracolumbar strengthening exercises and no lumbopelvic perturbation with scaption/ opposite hip ext in order to maintain strength with lfiting at work and [place]    Time  8    Period  Weeks    Status  New      PT LONG TERM GOAL  #9   TITLE  Pt will report no radiating pain down BLE or single LE across 2 months in order to carry and care for her grandson while  performing lifting at work    Time  12    Period  Weeks    Status  New    Target Date  09/02/17            Plan - 07/23/17 1528    Clinical Impression Statement Pt is progressing well with decreasing radiating pain. The numbness over the dorsum of her R foot resolved but she continues to have L lateral thigh radaiting pain from her back. This pain decreased post Tx today which addressed the source of the pain from lower thoracic/lumbar region. Pt showed decreased mmt ensions and increased mobility at the spinal segments in addition to increased posterior intercostal mm.  Pt will require more strengthening of thoracolumbar system to counteract the weight of breasts and repeated lifting/ pulling at work and with grandbaby. Pt continues to benefit from skilled PT.    Rehab Potential  Good    PT Frequency  1x / week    PT Duration  12 weeks    PT Treatment/Interventions  ADLs/Self Care Home Management;Aquatic Therapy;Neuromuscular re-education;Therapeutic exercise;Balance training;Moist Heat;Traction;Patient/family education;Gait training;Stair training;Scar mobilization;Manual lymph drainage;Manual techniques;Energy conservation;Taping;Functional mobility training;Therapeutic activities    Consulted and Agree with Plan of Care  Patient       Patient will benefit from skilled therapeutic intervention in order to improve the following deficits and impairments:  Pain, Improper body mechanics, Decreased mobility, Decreased strength, Postural dysfunction, Decreased scar mobility, Hypomobility, Decreased activity tolerance, Decreased endurance, Decreased range of motion, Decreased coordination, Increased muscle spasms, Decreased safety awareness, Impaired sensation  Visit Diagnosis: Sacrococcygeal disorders, not elsewhere classified  Other abnormalities of gait and mobility  Chronic left shoulder pain  Chronic right shoulder pain  Numbness and tingling in both hands  Muscle weakness  (generalized)     Problem List There are no active problems to display for this patient.   Jerl Mina ,PT, DPT, E-RYT  07/23/2017, 3:28 PM  [place] MAIN [facility] SERVICES [address] [place], [place], [address] Phone: [phone]   Fax:  [phone]  Name: Joann Brown MRN: [id number] Date of Birth: October 16, 1965

## 2017-07-27 ENCOUNTER — Ambulatory Visit: Payer: BLUE CROSS/BLUE SHIELD | Admitting: Physical Therapy

## 2017-07-27 DIAGNOSIS — G8929 Other chronic pain: Secondary | ICD-10-CM

## 2017-07-27 DIAGNOSIS — R202 Paresthesia of skin: Secondary | ICD-10-CM

## 2017-07-27 DIAGNOSIS — M25512 Pain in left shoulder: Secondary | ICD-10-CM

## 2017-07-27 DIAGNOSIS — M25511 Pain in right shoulder: Secondary | ICD-10-CM

## 2017-07-27 DIAGNOSIS — M533 Sacrococcygeal disorders, not elsewhere classified: Secondary | ICD-10-CM

## 2017-07-27 DIAGNOSIS — R2689 Other abnormalities of gait and mobility: Secondary | ICD-10-CM

## 2017-07-27 DIAGNOSIS — M6281 Muscle weakness (generalized): Secondary | ICD-10-CM

## 2017-07-27 DIAGNOSIS — R200 Anesthesia of skin: Secondary | ICD-10-CM

## 2017-07-27 NOTE — Therapy (Signed)
[place] MAIN [facility] SERVICES [address] [place], [place], [address] Phone: [phone]   Fax:  [phone]  Physical Therapy Treatment  Patient Details  Name: Joann Brown MRN: [id number] Date of Birth: [birth year] Referring Provider: Bernadene Bell, MD    Encounter Date: 07/27/2017  PT End of Session - 07/27/17 1055    Visit Number  14    Date for PT Re-Evaluation  09/01/17    PT Start Time  1006    PT Stop Time  3154    PT Time Calculation (min)  47 min    Activity Tolerance  Patient tolerated treatment well;No increased pain    Behavior During Therapy  WFL for tasks assessed/performed       Past Medical History:  Diagnosis Date  . Anxiety   . Cancer [facility]) 2007    hysterectomy to remove tumor in uterus   . Depression       Subjective Assessment - 07/27/17 1012    Subjective  Pt reports 70-80% better with her radiating pain. She only feels radiating the side of her L thigh and no more weakness in her foot. The pain in her back has also improved.          [facility] PT Assessment - 07/27/17 1048      Observation/Other Assessments   Observations  decreased sensation on LLE at L5 dermatome ( pre Tx) and equal sensation ( postTx)       Palpation   Spinal mobility  reporduction of radiating pain with palpation at T12/L1/L2 lateral aspect of segments (slight L lateral shift)                   [facility] Adult PT Treatment/Exercise - 07/27/17 1050      Neuro Re-ed    Neuro Re-ed Details   see pt instructions      Manual Therapy   Manual therapy comments  medial mobs at T10-L2 L aspect , STM interspinals with MWM , and at intercostals             PT Education - 07/27/17 1055    Education provided  Yes    Education Details  HEP    Person(s) Educated  Patient    Methods  Explanation;Demonstration;Tactile cues;Verbal cues    Comprehension  Verbalized understanding;Returned demonstration;Verbal cues  required;Tactile cues required          PT Long Term Goals - 07/27/17 1341      PT LONG TERM GOAL #1   Title  Pt will demo no coccyx deviation R and no tenderness / tensions at coccygeus across 2 visits in order to minimize pain and be able to sit / stand for longer periods     Time  12    Period  Weeks    Status  Achieved      PT LONG TERM GOAL #2   Title  Pt will be able to demo decreased abdominal scar restrictions in order to progress to deep core strengthening and minimize pain     Time  12    Period  Weeks    Status  Achieved      PT LONG TERM GOAL #3   Title  Pt will demo increaed hip flexion from 90 deg to full ROM and no R SIJ hypomobility/ pelvic girdle obliquities in supine in order to progress to being able to tie shoes     Time  12    Period  Weeks    Status  Achieved      PT LONG TERM GOAL #4   Title  Pt will decrease her [place] score from 41% to < 36 % in order to work and lift at work with less LBP and to walk without less abdominal pain ( 10/25: 23%)      Time  12    Period  Weeks    Status  Achieved      PT LONG TERM GOAL #5   Title  Pt will be able to tie her shoes with less pain in her R groin/ low abdominal area in order perform ADLs.    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    Time  27    Period  Weeks    Status  Achieved      PT LONG TERM GOAL #7   Title  Pt will demo less forward head posture in order to minimize shoulder pain to perform ADLs    Time  12    Period  Weeks    Status  Achieved      PT LONG TERM GOAL #8   Title  Pt will be able to demo 20 reps with red band with thoracolumbar strengthening exercises and no lumbopelvic perturbation with scaption/ opposite hip ext in order to maintain strength with lfiting at work and [place]    Time  8    Period  Weeks    Status  New       PT LONG TERM GOAL  #9   TITLE  Pt will report no radiating pain down BLE or single LE across 2 months in order to carry and care for her grandson while performing lifting at work    Time  12    Period  Weeks    Status  Partially Met            Plan - 07/27/17 1339    Clinical Impression Statement Pt is progressing well with report of no SIJ pain and only has radiating pain at L lateral thigh which resolved after manual Tx. Addressed remaining hypomobility/ slight lateral shift of segments at L  T12-L2, tightenss at L interspinals/posterior lateral intercostals mm on L. Pt's sensation returned along L5 dermatome on L. post Tx. Advanced posterior chain strengthening to impove strength. Pt continues to benefit from skilled PT.      Rehab Potential  Good    PT Frequency  1x / week    PT Duration  12 weeks    PT Treatment/Interventions  ADLs/Self Care Home Management;Aquatic Therapy;Neuromuscular re-education;Therapeutic exercise;Balance training;Moist Heat;Traction;Patient/family education;Gait training;Stair training;Scar mobilization;Manual lymph drainage;Manual techniques;Energy conservation;Taping;Functional mobility training;Therapeutic activities    Consulted and Agree with Plan of Care  Patient       Patient will benefit from skilled therapeutic intervention in order to improve the following deficits and impairments:  Pain, Improper body mechanics, Decreased mobility, Decreased strength, Postural dysfunction, Decreased scar mobility, Hypomobility, Decreased activity tolerance, Decreased endurance, Decreased range of motion, Decreased coordination, Increased muscle spasms, Decreased safety awareness, Impaired sensation  Visit Diagnosis: Sacrococcygeal disorders, not elsewhere classified  Other abnormalities of gait and mobility  Chronic right shoulder pain  Chronic left shoulder pain  Numbness and tingling  in both hands  Muscle weakness (generalized)     Problem  List There are no active problems to display for this patient.   Jerl Mina ,PT, DPT, E-RYT  07/27/2017, 1:44 PM  [place] MAIN [facility] SERVICES [address] [place], [place], [address] Phone: [phone]   Fax:  [phone]  Name: Joann Brown MRN: [id number] Date of Birth: [birth year]

## 2017-07-27 NOTE — Patient Instructions (Signed)
See videos on your phone  "Throw the plate overhead"  - Blue band   Lunge position, toes point forward, trunk leans to create diagonal line with back leg)  Ski track stance, band under back foot,  50% weight on front leg (knee above ankle)  50% weight on back leg   Elbows point to the sky, hands holding band   Inhale, exhale extend elbows like you are throwing something overhead,  10 reps on each leg  X 2 sets     Minsquat with bicep curl -blue band Make sure back is not arched  Elbow by ribs the entire time  10 reps x 3    Stretch this week By the wall Reach up on L side, lower elbow and then slight turn to the ~30deg

## 2017-08-01 ENCOUNTER — Ambulatory Visit: Payer: BLUE CROSS/BLUE SHIELD | Admitting: Physical Therapy

## 2017-08-10 ENCOUNTER — Ambulatory Visit: Payer: BLUE CROSS/BLUE SHIELD | Admitting: Physical Therapy

## 2017-08-10 DIAGNOSIS — M533 Sacrococcygeal disorders, not elsewhere classified: Secondary | ICD-10-CM

## 2017-08-10 DIAGNOSIS — R2689 Other abnormalities of gait and mobility: Secondary | ICD-10-CM

## 2017-08-10 DIAGNOSIS — M6281 Muscle weakness (generalized): Secondary | ICD-10-CM

## 2017-08-11 NOTE — Therapy (Signed)
[place] MAIN [facility] SERVICES [address] [place], [place], [address] Phone: [phone]   Fax:  [phone]  Physical Therapy Treatment  Patient Details  Name: Joann Brown MRN: [id number] Date of Birth: 1965-07-29 Referring Provider: Bernadene Bell, MD    Encounter Date: 08/10/2017  PT End of Session - 08/11/17 2132    Visit Number  15    Date for PT Re-Evaluation  09/01/17    PT Start Time  0915    PT Stop Time  1000    PT Time Calculation (min)  45 min    Activity Tolerance  Patient tolerated treatment well;No increased pain    Behavior During Therapy  WFL for tasks assessed/performed       Past Medical History:  Diagnosis Date  . Anxiety   . Cancer [facility]) 2007    hysterectomy to remove tumor in uterus   . Depression      There were no vitals filed for this visit.  Subjective Assessment - 08/11/17 2120    Subjective  Pt reports her pain is 60% improved without the numbness down the leg for the past few days.          [facility] PT Assessment - 08/11/17 2126      Observation/Other Assessments   Observations  pt did not require cues for decreasing lumbar lordosis       Sensation   Light Touch  -- equal sensation along all dermatomes BLE       Squat   Comments  cued for knees below toes      ROM / Strength   AROM / PROM / Strength  -- BLE WFL       Palpation   Spinal mobility  no spinal tensions, restored mobility along thoracic segments, no reproduction of pain                  [facility] Adult PT Treatment/Exercise - 08/11/17 2125      Neuro Re-ed    Neuro Re-ed Details   see pt instructions             PT Education - 08/11/17 2131    Education provided  Yes    Education Details  HEP, recorded on phone    Person(s) Educated  Patient    Methods  Explanation;Demonstration;Tactile cues;Verbal cues    Comprehension  Returned demonstration;Verbalized understanding          PT Long  Term Goals - 07/27/17 1341      PT LONG TERM GOAL #1   Title  Pt will demo no coccyx deviation R and no tenderness / tensions at coccygeus across 2 visits in order to minimize pain and be able to sit / stand for longer periods     Time  12    Period  Weeks    Status  Achieved      PT LONG TERM GOAL #2   Title  Pt will be able to demo decreased abdominal scar restrictions in order to progress to deep core strengthening and minimize pain     Time  12    Period  Weeks    Status  Achieved      PT LONG TERM GOAL #3   Title  Pt will demo increaed hip flexion from 90 deg to full ROM and no R SIJ hypomobility/ pelvic girdle obliquities in supine in order to progress to being able to tie shoes     Time  12    Period  Weeks    Status  Achieved      PT LONG TERM GOAL #4   Title  Pt will decrease her [place] score from 41% to < 36 % in order to work and lift at work with less LBP and to walk without less abdominal pain ( 10/25: 23%)      Time  12    Period  Weeks    Status  Achieved      PT LONG TERM GOAL #5   Title  Pt will be able to tie her shoes with less pain in her R groin/ low abdominal area in order perform ADLs.    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    Time  85    Period  Weeks    Status  Achieved      PT LONG TERM GOAL #7   Title  Pt will demo less forward head posture in order to minimize shoulder pain to perform ADLs    Time  12    Period  Weeks    Status  Achieved      PT LONG TERM GOAL #8   Title  Pt will be able to demo 20 reps with red band with thoracolumbar strengthening exercises and no lumbopelvic perturbation with scaption/ opposite hip ext in order to maintain strength with lfiting at work and [place]    Time  8    Period  Weeks    Status  New      PT LONG TERM GOAL  #9   TITLE  Pt will report no  radiating pain down BLE or single LE across 2 months in order to carry and care for her grandson while performing lifting at work    Time  12    Period  Weeks    Status  Partially Met            Plan - 08/11/17 2133    Clinical Impression Statement  Pt 's radiating pain is improving and her sensation has been restored. Pt progressed to thraocolumbar/glut strengthening with resistance band. Pt required propioception cues. Pt continues to benefit from skilled PT.    Rehab Potential  Good    PT Frequency  1x / week    PT Duration  12 weeks    PT Treatment/Interventions  ADLs/Self Care Home Management;Aquatic Therapy;Neuromuscular re-education;Therapeutic exercise;Balance training;Moist Heat;Traction;Patient/family education;Gait training;Stair training;Scar mobilization;Manual lymph drainage;Manual techniques;Energy conservation;Taping;Functional mobility training;Therapeutic activities    Consulted and Agree with Plan of Care  Patient       Patient will benefit from skilled therapeutic intervention in order to improve the following deficits and impairments:  Pain, Improper body mechanics, Decreased mobility, Decreased strength, Postural dysfunction, Decreased scar mobility, Hypomobility, Decreased activity tolerance, Decreased endurance, Decreased range of motion, Decreased coordination, Increased muscle spasms, Decreased safety awareness, Impaired sensation  Visit Diagnosis: Sacrococcygeal disorders, not elsewhere classified  Other abnormalities of gait and mobility  Muscle weakness (generalized)     Problem List There are no active problems to display for this patient.   Jerl Mina ,PT, DPT, E-RYT  08/11/2017, 9:35 PM  [place] MAIN [facility] SERVICES [place], [place],  [place] Phone: [phone]   Fax:  [phone]  Name: Kyung Muto MRN: [id number] Date of Birth: [birth year]

## 2017-08-11 NOTE — Patient Instructions (Signed)
Mini squat with band under feet  30 reps   Walking with band at waist forward and backward 2 min Slowed and controlled, [place] heel   Pulling band from doorknob ( facing away from door)  Shoulders down  30 reps

## 2017-08-18 ENCOUNTER — Ambulatory Visit: Payer: BLUE CROSS/BLUE SHIELD | Admitting: Physical Therapy

## 2017-08-25 ENCOUNTER — Ambulatory Visit: Payer: BLUE CROSS/BLUE SHIELD | Attending: Psychiatry | Admitting: Physical Therapy

## 2017-08-25 DIAGNOSIS — M6281 Muscle weakness (generalized): Secondary | ICD-10-CM | POA: Insufficient documentation

## 2017-08-25 DIAGNOSIS — M25512 Pain in left shoulder: Secondary | ICD-10-CM | POA: Insufficient documentation

## 2017-08-25 DIAGNOSIS — M25511 Pain in right shoulder: Secondary | ICD-10-CM | POA: Insufficient documentation

## 2017-08-25 DIAGNOSIS — M533 Sacrococcygeal disorders, not elsewhere classified: Secondary | ICD-10-CM | POA: Diagnosis present

## 2017-08-25 DIAGNOSIS — R202 Paresthesia of skin: Secondary | ICD-10-CM | POA: Diagnosis present

## 2017-08-25 DIAGNOSIS — R2689 Other abnormalities of gait and mobility: Secondary | ICD-10-CM | POA: Insufficient documentation

## 2017-08-25 DIAGNOSIS — G8929 Other chronic pain: Secondary | ICD-10-CM | POA: Diagnosis present

## 2017-08-25 DIAGNOSIS — R200 Anesthesia of skin: Secondary | ICD-10-CM | POA: Diagnosis present

## 2017-08-25 NOTE — Patient Instructions (Signed)
Walking with band, body face away from door Pressing band down and puffing back, trunk leans slightly Marching with thigh high exhaling on lift, placing foot back down under hips 90 sec x 3   Stretches  Seated Inhale tall, exhale keep pelvis straight and planted on the seat while trunk and neck turns  5 reps    "Rainbow" -> Hug a ball" 5 reps

## 2017-08-26 NOTE — Therapy (Signed)
[facility] SERVICES [address] [place], [place], [address] Phone: [phone]   Fax:  [phone]  Physical Therapy Treatment  Patient Details  Name: Joann Brown MRN: [id number] Date of Birth: 1965/07/01 Referring Provider: Bernadene Bell, MD    Encounter Date: 08/25/2017  PT End of Session - 08/25/17 1117    Visit Number  16    Date for PT Re-Evaluation  09/01/17    PT Start Time  1115    PT Stop Time  1148    PT Time Calculation (min)  33 min    Activity Tolerance  Patient tolerated treatment well;No increased pain    Behavior During Therapy  WFL for tasks assessed/performed       Past Medical History:  Diagnosis Date  . Anxiety   . Cancer [facility]) 2007    hysterectomy to remove tumor in uterus   . Depression      Subjective Assessment - 08/25/17 1117    Subjective  Pt reports her pain has improved by 70%.  She has not had radiating pain for the past 2 weeks.The numbness in her L foot has resolved.  Pt is having trouble turning her back to wipe her buttocks with bowel movements         [facility] PT Assessment - 08/26/17 1030      Observation/Other Assessments   Observations  no cues for minimizing lumbar lordosis.       AROM   Overall AROM   -- full rotation w/cue for elongation of spine, segmental rotat      Palpation   Palpation comment  less spinal tensions. moderate tightness at posterior intercostals mm B                   [facility] Adult PT Treatment/Exercise - 08/26/17 1033      Exercises   Exercises  -- see pt instructions      Manual Therapy   Manual therapy comments  intercostal release with MWM             PT Education - 08/26/17 1032    Education provided  Yes    Education Details  HEP    Person(s) Educated  Patient    Methods  Explanation;Demonstration;Tactile cues;Verbal cues;Handout    Comprehension  Returned demonstration;Verbalized understanding          PT  Long Term Goals - 08/26/17 1035      PT LONG TERM GOAL #1   Title  Pt will demo no coccyx deviation R and no tenderness / tensions at coccygeus across 2 visits in order to minimize pain and be able to sit / stand for longer periods     Time  12    Period  Weeks    Status  Achieved      PT LONG TERM GOAL #2   Title  Pt will be able to demo decreased abdominal scar restrictions in order to progress to deep core strengthening and minimize pain     Time  12    Period  Weeks    Status  Achieved      PT LONG TERM GOAL #3   Title  Pt will demo increaed hip flexion from 90 deg to full ROM and no R SIJ hypomobility/ pelvic girdle obliquities in supine in order to progress to being able to tie shoes     Time  12    Period  Weeks    Status  Achieved      PT LONG TERM GOAL #4   Title  Pt will decrease her Marshall score from 41% to < 36 % in order to work and lift at work with less LBP and to walk without less abdominal pain ( 10/25: 23%)      Time  12    Period  Weeks    Status  Achieved      PT LONG TERM GOAL #5   Title  Pt will be able to tie her shoes with less pain in her R groin/ low abdominal area in order perform ADLs.    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    Time  58    Period  Weeks    Status  Achieved      PT LONG TERM GOAL #7   Title  Pt will demo less forward head posture in order to minimize shoulder pain to perform ADLs    Time  12    Period  Weeks    Status  Achieved      PT LONG TERM GOAL #8   Title  Pt will be able to demo 20 reps with red band with thoracolumbar strengthening exercises and no lumbopelvic perturbation with scaption/ opposite hip ext in order to maintain strength with lfiting at work and [place]    Time  8    Period  Weeks     Status  New      PT LONG TERM GOAL  #9   TITLE  Pt will report no radiating pain down BLE or single LE across 2 months in order to carry and care for her grandson while performing lifting at work    Time  12    Period  Weeks    Status  Partially Met      PT LONG TERM GOAL  #10   TITLE  Pt will report having no difficulty with turning her spine while on the toilet to impove self-hygiene    Time  12    Period  Weeks    Status  New    Target Date  11/18/17            Plan - 08/26/17 1034    Clinical Impression Statement  Pt continues to have no radiating pain down her legs. The numbness in the dorsal aspect of her foot have resolved. Advanced pt with more thoracolumbar and  oblique mm strengthening. Pt demo'd form correctly. Added trunk rotation stretches to help pt progress towards her goal to achieve trunk rotation for self-cleaning with toileting. Pt continues to benefit from skilled PT.     Rehab Potential  Good    PT Frequency  1x / week    PT Duration  12 weeks    PT Treatment/Interventions  ADLs/Self Care Home Management;Aquatic Therapy;Neuromuscular re-education;Therapeutic exercise;Balance training;Moist Heat;Traction;Patient/family education;Gait training;Stair training;Scar mobilization;Manual lymph drainage;Manual techniques;Energy conservation;Taping;Functional mobility training;Therapeutic activities    Consulted and Agree with Plan of Care  Patient       Patient will benefit from skilled therapeutic intervention in order to improve the following deficits and impairments:  Pain, Improper  body mechanics, Decreased mobility, Decreased strength, Postural dysfunction, Decreased scar mobility, Hypomobility, Decreased activity tolerance, Decreased endurance, Decreased range of motion, Decreased coordination, Increased muscle spasms, Decreased safety awareness, Impaired sensation  Visit Diagnosis: Sacrococcygeal disorders, not elsewhere classified  Other abnormalities of  gait and mobility  Muscle weakness (generalized)  Chronic right shoulder pain  Numbness and tingling in both hands  Chronic left shoulder pain     Problem List There are no active problems to display for this patient.   Jerl Mina ,PT, DPT, E-RYT  08/26/2017, 10:42 AM  [place] [facility] SERVICES [address] [place], [place], [address] Phone: [phone]   Fax:  [phone]  Name: Joann Brown MRN: [id number] Date of Birth: May 21, 1966

## 2017-09-01 ENCOUNTER — Ambulatory Visit: Payer: BLUE CROSS/BLUE SHIELD | Admitting: Physical Therapy

## 2017-09-08 ENCOUNTER — Ambulatory Visit: Payer: BLUE CROSS/BLUE SHIELD | Admitting: Physical Therapy

## 2017-09-08 ENCOUNTER — Encounter: Payer: BLUE CROSS/BLUE SHIELD | Admitting: Physical Therapy

## 2017-09-08 DIAGNOSIS — R200 Anesthesia of skin: Secondary | ICD-10-CM

## 2017-09-08 DIAGNOSIS — M533 Sacrococcygeal disorders, not elsewhere classified: Secondary | ICD-10-CM | POA: Diagnosis not present

## 2017-09-08 DIAGNOSIS — G8929 Other chronic pain: Secondary | ICD-10-CM

## 2017-09-08 DIAGNOSIS — R2689 Other abnormalities of gait and mobility: Secondary | ICD-10-CM

## 2017-09-08 DIAGNOSIS — R202 Paresthesia of skin: Secondary | ICD-10-CM

## 2017-09-08 DIAGNOSIS — M25511 Pain in right shoulder: Secondary | ICD-10-CM

## 2017-09-08 DIAGNOSIS — M25512 Pain in left shoulder: Secondary | ICD-10-CM

## 2017-09-08 DIAGNOSIS — M6281 Muscle weakness (generalized): Secondary | ICD-10-CM

## 2017-09-08 NOTE — Patient Instructions (Signed)
Recorded these on your phone:   Upgrade of side step/ mini squat with red band at thighs   Stepping back with band at thigh like you are walking with arm swinging, bodying leaning forward slightly 2-3 min   Keep up with the bridging series by the doorknob with the next band up ( from red > green > blue , progressing 10 x 3 to 10 x2 when initially changing bands to the next resistance)     Deep core for relaxation and deep core strengthening    ___  Maintain stretches always

## 2017-09-09 NOTE — Therapy (Signed)
[place] MAIN [facility] SERVICES [address] [place], [place], [address] Phone: [phone]   Fax:  [phone]  Physical Therapy Treatment / Progress Note  Patient Details  Name: Joann Brown MRN: [id number] Date of Birth: 1966/06/08 Referring Provider: Bernadene Bell, MD    Encounter Date: 09/08/2017    Past Medical History:  Diagnosis Date  . Anxiety   . Cancer [facility]) 2007    hysterectomy to remove tumor in uterus   . Depression      Subjective Assessment - 09/09/17 2131    Subjective  Pt has been doing PT at [facility] for her hands and shoulders. Pt has not had radiating pain down her legs and LBP.  Pt has been able to turn her body to clean herself while on the toilet         [facility] PT Assessment - 09/09/17 2123      Observation/Other Assessments   Observations  slightly tight upper trap mm, no tightness along paraspinals             No data recorded       [facility] Adult PT Treatment/Exercise - 09/09/17 2123      Neuro Re-ed    Neuro Re-ed Details   see pt instructions                  PT Long Term Goals - 09/08/17 1120      PT LONG TERM GOAL #1   Title  Pt will demo no coccyx deviation R and no tenderness / tensions at coccygeus across 2 visits in order to minimize pain and be able to sit / stand for longer periods     Time  12    Period  Weeks    Status  Achieved      PT LONG TERM GOAL #2   Title  Pt will be able to demo decreased abdominal scar restrictions in order to progress to deep core strengthening and minimize pain     Time  12    Period  Weeks    Status  Achieved      PT LONG TERM GOAL #3   Title  Pt will demo increaed hip flexion from 90 deg to full ROM and no R SIJ hypomobility/ pelvic girdle obliquities in supine in order to progress to being able to tie shoes     Time  12    Period  Weeks    Status  Achieved      PT LONG TERM GOAL #4   Title  Pt will decrease her Speed score  from 41% to < 36 % in order to work and lift at work with less LBP and to walk without less abdominal pain ( 10/25: 23%)      Time  12    Period  Weeks    Status  Achieved      PT LONG TERM GOAL #5   Title  Pt will be able to tie her shoes with less pain in her R groin/ low abdominal area in order perform ADLs.    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    Time  66    Period  Weeks  Status  Achieved      PT LONG TERM GOAL #7   Title  Pt will demo less forward head posture in order to minimize shoulder pain to perform ADLs    Time  12    Period  Weeks    Status  Achieved      PT LONG TERM GOAL #8   Title  Pt will be able to demo 20 reps with red band with thoracolumbar strengthening exercises and no lumbopelvic perturbation with scaption/ opposite hip ext in order to maintain strength with lfiting at work and [place]    Time  8    Period  Weeks    Status  Achieved      PT LONG TERM GOAL  #9   TITLE  Pt will report no radiating pain down BLE or single LE across 2 months in order to carry and care for her grandson while performing lifting at work    Time  12    Period  Weeks    Status  Achieved      PT LONG TERM GOAL  #10   TITLE  Pt will report having no difficulty with turning her spine while on the toilet to impove self-hygiene    Time  12    Period  Weeks    Status  Achieved            Plan - 09/09/17 2124    Clinical Impression Statement  Pt has achieved 100% of her goals since starting PT.  Pt has no had any relapses of radiating pain for past month and has fully regained BLE strength.  Pt has been able twist her torso for self-hygiene while on the toilet,  to cross her ankle over opposite thigh to tie her shoe, lift her grandbaby and perform work tasks with proper lifting mechanics.  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  Resolution of the following issues include: coccyx deviation, overactive pelvic floor mm/ back mm, limited spinal/hip/ SIJ mobility, and pelvic /abdominal scar restrictions. . Pt was educated today on yoga poses and proper alignment to minimize risk of injuries. Pt voiced she is interested in trying yoga for health and maintenance. Pt was provided DVD resources for gentle forms of yoga. Pt has an appt schedule in 2 months to ensure pt has no issue upon trying out yoga on her own. Pt will be ready for d/c at that time.      Rehab Potential  Good    PT Frequency  1x / week    PT Duration  12 weeks    PT Treatment/Interventions  ADLs/Self Care Home Management;Aquatic Therapy;Neuromuscular re-education;Therapeutic exercise;Balance training;Moist Heat;Traction;Patient/family education;Gait training;Stair training;Scar mobilization;Manual lymph drainage;Manual techniques;Energy conservation;Taping;Functional mobility training;Therapeutic activities    Consulted and Agree with Plan of Care  Patient       Patient will benefit from skilled therapeutic intervention in order to improve the following deficits and impairments:  Pain, Improper body mechanics, Decreased mobility, Decreased strength, Postural dysfunction, Decreased scar mobility, Hypomobility, Decreased activity tolerance, Decreased endurance, Decreased range of motion, Decreased coordination, Increased muscle spasms, Decreased safety awareness, Impaired sensation  Visit Diagnosis: Sacrococcygeal disorders, not elsewhere classified  Other abnormalities of gait and mobility  Muscle weakness (generalized)  Chronic right shoulder pain  Numbness and tingling in both hands  Chronic left shoulder pain  Problem List There are no active problems to display  for this patient.   Jerl Mina ,PT, DPT, E-RYT  09/09/2017, 9:31 PM  [place] MAIN [facility] SERVICES [address] [place], [place], [address] Phone: [phone]   Fax:  [phone]  Name: Mikia Delaluz MRN: [id number] Date of Birth: October 01, 1965

## 2017-09-15 ENCOUNTER — Encounter: Payer: BLUE CROSS/BLUE SHIELD | Admitting: Physical Therapy

## 2017-09-22 ENCOUNTER — Ambulatory Visit: Payer: BLUE CROSS/BLUE SHIELD | Admitting: Physical Therapy

## 2017-11-11 ENCOUNTER — Ambulatory Visit: Payer: BLUE CROSS/BLUE SHIELD | Attending: Psychiatry | Admitting: Physical Therapy

## 2017-12-13 ENCOUNTER — Other Ambulatory Visit: Payer: Self-pay

## 2017-12-13 ENCOUNTER — Emergency Department
Admission: EM | Admit: 2017-12-13 | Discharge: 2017-12-13 | Disposition: A | Discharge status: Home / Self Care | Payer: BLUE CROSS/BLUE SHIELD | Attending: Emergency Medicine | Admitting: Emergency Medicine

## 2017-12-13 ENCOUNTER — Encounter: Payer: Self-pay | Admitting: Emergency Medicine

## 2017-12-13 DIAGNOSIS — T63311A Toxic effect of venom of black widow spider, accidental (unintentional), initial encounter: Secondary | ICD-10-CM | POA: Insufficient documentation

## 2017-12-13 DIAGNOSIS — X58XXXA Exposure to other specified factors, initial encounter: Secondary | ICD-10-CM | POA: Diagnosis not present

## 2017-12-13 DIAGNOSIS — Y92017 Garden or yard in single-family (private) house as the place of occurrence of the external cause: Secondary | ICD-10-CM | POA: Insufficient documentation

## 2017-12-13 DIAGNOSIS — S60861A Insect bite (nonvenomous) of right wrist, initial encounter: Secondary | ICD-10-CM | POA: Diagnosis present

## 2017-12-13 DIAGNOSIS — Y999 Unspecified external cause status: Secondary | ICD-10-CM | POA: Insufficient documentation

## 2017-12-13 DIAGNOSIS — Y939 Activity, unspecified: Secondary | ICD-10-CM | POA: Insufficient documentation

## 2017-12-13 LAB — CBC WITH DIFFERENTIAL/PLATELET
BASOS PCT: 1 %
Basophils Absolute: 0.1 10*3/uL (ref 0–0.1)
Eosinophils Absolute: 0.1 10*3/uL (ref 0–0.7)
Eosinophils Relative: 2 %
HEMATOCRIT: 42.4 % (ref 35.0–47.0)
Hemoglobin: 14.7 g/dL (ref 12.0–16.0)
Lymphocytes Relative: 36 %
Lymphs Abs: 2.5 10*3/uL (ref 1.0–3.6)
MCH: 29.7 pg (ref 26.0–34.0)
MCHC: 34.8 g/dL (ref 32.0–36.0)
MCV: 85.6 fL (ref 80.0–100.0)
MONO ABS: 0.4 10*3/uL (ref 0.2–0.9)
MONOS PCT: 5 %
NEUTROS ABS: 4 10*3/uL (ref 1.4–6.5)
Neutrophils Relative %: 56 %
Platelets: 280 10*3/uL (ref 150–440)
RBC: 4.95 MIL/uL (ref 3.80–5.20)
RDW: 12.9 % (ref 11.5–14.5)
WBC: 7 10*3/uL (ref 3.6–11.0)

## 2017-12-13 LAB — COMPREHENSIVE METABOLIC PANEL
ALBUMIN: 4.6 g/dL (ref 3.5–5.0)
ALT: 21 U/L (ref 0–44)
ANION GAP: 10 (ref 5–15)
AST: 26 U/L (ref 15–41)
Alkaline Phosphatase: 76 U/L (ref 38–126)
BUN: 14 mg/dL (ref 6–20)
CHLORIDE: 105 mmol/L (ref 98–111)
CO2: 24 mmol/L (ref 22–32)
Calcium: 9.3 mg/dL (ref 8.9–10.3)
Creatinine, Ser: 0.52 mg/dL (ref 0.44–1.00)
GFR calc Af Amer: >60 mL/min (ref >60)
GFR calc non Af Amer: >60 mL/min (ref >60)
GLUCOSE: 89 mg/dL (ref 70–99)
POTASSIUM: 3.7 mmol/L (ref 3.5–5.1)
SODIUM: 139 mmol/L (ref 135–145)
TOTAL PROTEIN: 8.4 g/dL — AB (ref 6.5–8.1)
Total Bilirubin: 0.8 mg/dL (ref 0.3–1.2)

## 2017-12-13 LAB — CK: Total CK: 65 U/L (ref 38–234)

## 2017-12-13 LAB — TROPONIN I: Troponin I: <0.03 ng/mL (ref <0.03)

## 2017-12-13 MED ORDER — HYDROMORPHONE HCL 1 MG/ML IJ SOLN
0.5000 mg | Freq: Once | INTRAMUSCULAR | Status: AC
  Administered 2017-12-13: 0.5 mg via INTRAVENOUS
  Filled 2017-12-13: qty 1

## 2017-12-13 MED ORDER — DIAZEPAM 5 MG PO TABS: 5.0000 mg | ORAL_TABLET | Freq: Three times a day (TID) | ORAL | 0 refills | Status: DC | PRN

## 2017-12-13 MED ORDER — HYDROCODONE-ACETAMINOPHEN 5-325 MG PO TABS: 1.0000 | ORAL_TABLET | Freq: Three times a day (TID) | ORAL | 0 refills | Status: DC | PRN

## 2017-12-13 MED ORDER — DIAZEPAM 5 MG/ML IJ SOLN
5.0000 mg | Freq: Once | INTRAMUSCULAR | Status: AC
  Administered 2017-12-13: 5 mg via INTRAVENOUS

## 2017-12-13 MED ORDER — ONDANSETRON HCL 4 MG/2ML IJ SOLN
4.0000 mg | Freq: Once | INTRAMUSCULAR | Status: AC
  Administered 2017-12-13: 4 mg via INTRAVENOUS
  Filled 2017-12-13: qty 2

## 2017-12-13 MED ORDER — KETOROLAC TROMETHAMINE 30 MG/ML IJ SOLN
30.0000 mg | Freq: Once | INTRAMUSCULAR | Status: AC
  Administered 2017-12-13: 30 mg via INTRAVENOUS
  Filled 2017-12-13: qty 1

## 2017-12-13 MED ORDER — SODIUM CHLORIDE 0.9 % IV SOLN
Freq: Once | INTRAVENOUS | Status: AC
  Administered 2017-12-13: 14:00:00 via INTRAVENOUS

## 2017-12-13 NOTE — ED Triage Notes (Signed)
Pt presents to ED c/o possible black widow spider bite. Reports she saw a black colored spider drop onto her arm while walking out of her house. States immediately severe pain that is traveling up pt's arm with muscle spasms. Redness and swelling noted to bite area.

## 2017-12-13 NOTE — ED Provider Notes (Signed)
[facility] Emergency Department Provider Note       Time seen: ----------------------------------------- 1:24 PM on 12/13/2017 -----------------------------------------   I have reviewed the triage vital signs and the nursing notes.  HISTORY   Chief Complaint Insect Bite    HPI Joann Brown is a 52 y.o. female with a history of anxiety, cancer and depression who presents to the ED for severe right-sided pain.  Patient states about 15 to 20 minutes prior to arrival she had a possible black widow spider bite.  She saw a black-colored spider drop onto her arm while she was walking out of her house.  Immediately she began having severe pain up the right arm and also on the right leg.  Redness and swelling was noted to the right wrist in triage.  She denies any other complaints at this time.  Past Medical History:  Diagnosis Date  . Anxiety   . Cancer [facility]) 2007    hysterectomy to remove tumor in uterus   . Depression     There are no active problems to display for this patient.   Past Surgical History:  Procedure Laterality Date  . ABDOMINAL HYSTERECTOMY     2/2 tumor in uterus   . APPENDECTOMY  1986  . BRAIN SURGERY  2014   coil placement for brain aneurysm     Allergies Latex; Morphine and related; and Percocet [oxycodone-acetaminophen]  Social History Social History   Tobacco Use  . Smoking status: Not on file  Substance Use Topics  . Alcohol use: Not on file  . Drug use: Not on file   Review of Systems Constitutional: Negative for fever. Cardiovascular: Negative for chest pain. Respiratory: Negative for shortness of breath. Gastrointestinal: Negative for abdominal pain, vomiting and diarrhea. Musculoskeletal: Positive right arm pain and muscle spasms Skin: Positive for skin erythema Neurological: Negative for headaches, focal weakness or numbness.  All systems negative/normal/unremarkable except as stated in the  HPI  ____________________________________________   PHYSICAL EXAM:  VITAL SIGNS: ED Triage Vitals  Enc Vitals Group     BP 12/13/17 1306 (!) 100/56     Pulse Rate 12/13/17 1306 66     Resp --      Temp 12/13/17 1306 98.4 F (36.9 C)     Temp Source 12/13/17 1306 Oral     SpO2 12/13/17 1306 94 %     Weight 12/13/17 1314 146 lb (66.2 kg)     Height 12/13/17 1314 4\' 11"  (1.499 m)     Head Circumference --      Peak Flow --      Pain Score 12/13/17 1314 10     Pain Loc --      Pain Edu? --      Excl. in [place]? --    Constitutional: Alert and oriented.  Mild distress. Eyes: Conjunctivae are normal. Normal extraocular movements. ENT   Head: Normocephalic and atraumatic.   Nose: No congestion/rhinnorhea.   Mouth/Throat: Mucous membranes are moist.   Neck: No stridor. Cardiovascular: Normal rate, regular rhythm. No murmurs, rubs, or gallops. Respiratory: Normal respiratory effort without tachypnea nor retractions. Breath sounds are clear and equal bilaterally. No wheezes/rales/rhonchi. Gastrointestinal: Soft and nontender. Normal bowel sounds Musculoskeletal: normal range of motion in extremities.  Mild tenderness and erythema to the right wrist dorsolaterally. Neurologic:  Normal speech and language. No gross focal neurologic deficits are appreciated.  Skin: Erythema and swelling is noted to the dorsum of the right wrist Psychiatric: Anxious mood and  affect ____________________________________________  ED COURSE:  As part of my medical decision making, I reviewed the following data within the [place] History obtained from family if available, nursing notes, old chart and ekg, as well as notes from prior ED visits. Patient presented for likely black widow spider bite, we will assess with labs and imaging as indicated at this time.   Procedures ____________________________________________   LABS (pertinent positives/negatives)  Labs Reviewed   COMPREHENSIVE METABOLIC PANEL - Abnormal; Notable for the following components:      Result Value   Total Protein 8.4 (*)    All other components within normal limits  CBC WITH DIFFERENTIAL/PLATELET  TROPONIN I  CK  CBG MONITORING, ED   ___________________________________________  DIFFERENTIAL DIAGNOSIS   Black widow spider bite, other insect envenomation  FINAL ASSESSMENT AND PLAN  Black widow bite   Plan: The patient had presented for likely black widow spider bite and resulting pain and spasms. Patient's labs were reassuring.  Patient is feeling better now after fluids, benzodiazepines and pain medicine.  She will be discharged with similar.  She is cleared for outpatient follow-up.  Laurence Aly, MD   Note: This note was generated in part or whole with voice recognition software. Voice recognition is usually quite accurate but there are transcription errors that can and very often do occur. I apologize for any typographical errors that were not detected and corrected.     Earleen Newport, MD 12/13/17 [phone]

## 2017-12-13 NOTE — ED Notes (Signed)
Pt c/o spider bite to right wrist area - the area is red/swollen and painful to touch - the pt is c/o pain and muscle cramping in entire body - Dr Jimmye Norman at bedside

## 2017-12-14 ENCOUNTER — Observation Stay
Admission: EM | Admit: 2017-12-14 | Discharge: 2017-12-17 | Disposition: A | Discharge status: Home / Self Care | Payer: BLUE CROSS/BLUE SHIELD | Attending: Specialist | Admitting: Specialist

## 2017-12-14 ENCOUNTER — Encounter: Payer: Self-pay | Admitting: Emergency Medicine

## 2017-12-14 DIAGNOSIS — Y929 Unspecified place or not applicable: Secondary | ICD-10-CM | POA: Insufficient documentation

## 2017-12-14 DIAGNOSIS — Z794 Long term (current) use of insulin: Secondary | ICD-10-CM | POA: Diagnosis not present

## 2017-12-14 DIAGNOSIS — Z90710 Acquired absence of both cervix and uterus: Secondary | ICD-10-CM | POA: Diagnosis not present

## 2017-12-14 DIAGNOSIS — T63311A Toxic effect of venom of black widow spider, accidental (unintentional), initial encounter: Principal | ICD-10-CM | POA: Insufficient documentation

## 2017-12-14 DIAGNOSIS — Z8249 Family history of ischemic heart disease and other diseases of the circulatory system: Secondary | ICD-10-CM | POA: Insufficient documentation

## 2017-12-14 DIAGNOSIS — Z79899 Other long term (current) drug therapy: Secondary | ICD-10-CM | POA: Diagnosis not present

## 2017-12-14 DIAGNOSIS — Z885 Allergy status to narcotic agent status: Secondary | ICD-10-CM | POA: Diagnosis not present

## 2017-12-14 DIAGNOSIS — T63311D Toxic effect of venom of black widow spider, accidental (unintentional), subsequent encounter: Secondary | ICD-10-CM | POA: Diagnosis present

## 2017-12-14 DIAGNOSIS — R110 Nausea: Secondary | ICD-10-CM | POA: Diagnosis not present

## 2017-12-14 DIAGNOSIS — Z91040 Latex allergy status: Secondary | ICD-10-CM | POA: Insufficient documentation

## 2017-12-14 DIAGNOSIS — R42 Dizziness and giddiness: Secondary | ICD-10-CM | POA: Diagnosis not present

## 2017-12-14 DIAGNOSIS — F418 Other specified anxiety disorders: Secondary | ICD-10-CM | POA: Diagnosis not present

## 2017-12-14 MED ORDER — ONDANSETRON HCL 4 MG/2ML IJ SOLN
4.0000 mg | Freq: Once | INTRAMUSCULAR | Status: AC
  Administered 2017-12-14: 4 mg via INTRAVENOUS
  Filled 2017-12-14: qty 2

## 2017-12-14 MED ORDER — LORAZEPAM 2 MG/ML IJ SOLN
2.0000 mg | Freq: Once | INTRAMUSCULAR | Status: AC
  Administered 2017-12-14: 2 mg via INTRAVENOUS
  Filled 2017-12-14: qty 1

## 2017-12-14 MED ORDER — ANTIVENIN LATRODECTUS MACTANS IJ KIT: 2.5000 mL | PACK | Freq: Once | INTRAMUSCULAR | Status: DC

## 2017-12-14 MED ORDER — SODIUM CHLORIDE 0.9 % IV BOLUS
1000.0000 mL | Freq: Once | INTRAVENOUS | Status: AC
  Administered 2017-12-14: 1000 mL via INTRAVENOUS

## 2017-12-14 MED ORDER — MORPHINE SULFATE (PF) 4 MG/ML IV SOLN
4.0000 mg | Freq: Once | INTRAVENOUS | Status: AC
  Administered 2017-12-14: 4 mg via INTRAVENOUS
  Filled 2017-12-14: qty 1

## 2017-12-14 NOTE — ED Notes (Signed)
Pt states was bitten by black widow spider to right arm yesterday and seen here for the same. States continues to have pain to right arm.

## 2017-12-14 NOTE — ED Provider Notes (Signed)
[facility] Emergency Department Provider Note  ____________________________________________   First MD Initiated Contact with Patient 12/14/17 2330     (approximate)  I have reviewed the triage vital signs and the nursing notes.   HISTORY  Chief Complaint Insect Bite   HPI Joann Brown is a 52 y.o. female who self presents to the emergency department with severe right wrist pain, abdominal pain, nausea, vomiting, chest pain, and muscle cramping that all began after being bit by a black widow spider on her right wrist yesterday.  She saw the spider fall down and bite her.  She recognized it as a widow.  She was initially seen in our emergency department yesterday and was given benzodiazepines and opioids and initially felt improved and was discharged home.  Over the course of the past 24 hours or so her pain is become severe.  She is now sweating cramping and unable to keep food down.  Her pain is severe.  Symptoms have been gradual onset.  Are nonradiating.  Nothing seems to make them better or worse.    Past Medical History:  Diagnosis Date  . Anxiety   . Cancer [facility]) 2007    hysterectomy to remove tumor in uterus   . Depression     Patient Active Problem List   Diagnosis Date Noted  . Black widow spider bite 12/15/2017  . Depression with anxiety 12/15/2017    Past Surgical History:  Procedure Laterality Date  . ABDOMINAL HYSTERECTOMY     2/2 tumor in uterus   . APPENDECTOMY  1986  . BRAIN SURGERY  2014   coil placement for brain aneurysm   . TONSILLECTOMY     52 years old    Prior to Admission medications   Medication Sig Start Date End Date Taking? Authorizing Provider  ALBUTEROL IN Inhale into the lungs.   Yes [provider]  amitriptyline (ELAVIL) 10 MG tablet Take 10 mg by mouth at bedtime.   Yes [provider]  buPROPion (ZYBAN) 150 MG 12 hr tablet Take 150 mg by mouth 2 (two) times daily.   Yes [provider]  diazepam (VALIUM) 5 MG tablet Take 1 tablet (5 mg total) by mouth every 8 (eight) hours as needed for muscle spasms. 12/13/17  Yes Earleen Newport, MD  cholecalciferol (VITAMIN D) 400 units TABS tablet Take 400 Units by mouth.    [provider]  cyclobenzaprine (FLEXERIL) 5 MG tablet Take 1 tablet (5 mg total) by mouth 3 (three) times daily as needed for muscle spasms. 12/16/17   Vaughan Basta, MD  HYDROcodone-acetaminophen (NORCO/VICODIN) 5-325 MG tablet Take 1 tablet by mouth 3 (three) times daily as needed for moderate pain. 12/16/17 12/16/18  Vaughan Basta, MD  Insulin Lispro (ADMELOG [place]) Inject into the skin.    [provider]  ondansetron (ZOFRAN-ODT) 4 MG disintegrating tablet Take 1 tablet (4 mg total) by mouth 3 (three) times daily before meals. 12/16/17   Vaughan Basta, MD  oxyCODONE (OXYCONTIN) 10 mg 12 hr tablet Take 1 tablet (10 mg total) by mouth every 12 (twelve) hours. 12/16/17   Vaughan Basta, MD    Allergies Latex; Morphine and related; and Percocet [oxycodone-acetaminophen]  Family History  Problem Relation Age of Onset  . Heart disease Father   . Heart disease Maternal Grandfather   . Heart disease Paternal Grandfather     Social History Social History   Tobacco Use  . Smoking status: Never Smoker  .  Smokeless tobacco: Never Used  Substance Use Topics  . Alcohol use: Not Currently  . Drug use: Not Currently    Review of Systems Constitutional: No fever/chills Eyes: No visual changes. ENT: No sore throat. Cardiovascular: Positive for chest pain. Respiratory: Positive for shortness of breath. Gastrointestinal: Positive for abdominal pain.  Positive for nausea, positive for vomiting.  No diarrhea.  No constipation. Genitourinary: Negative for dysuria. Musculoskeletal: Negative for back pain. Skin: Negative for rash. Neurological: Negative for headaches, focal weakness or  numbness.   ____________________________________________   PHYSICAL EXAM:  VITAL SIGNS: ED Triage Vitals  Enc Vitals Group     BP 12/14/17 2249 124/71     Pulse Rate 12/14/17 2249 75     Resp 12/14/17 2249 17     Temp 12/14/17 2249 98.6 F (37 C)     Temp Source 12/14/17 2249 Oral     SpO2 12/14/17 2249 98 %     Weight 12/14/17 2250 146 lb (66.2 kg)     Height --      Head Circumference --      Peak Flow --      Pain Score --      Pain Loc --      Pain Edu? --      Excl. in [place]? --     Constitutional: Alert and oriented x4 appears miserable sweating and cramping curled over Eyes: PERRL EOMI. Head: Atraumatic. Nose: No congestion/rhinnorhea. Mouth/Throat: No trismus Neck: No stridor.   Cardiovascular: Normal rate, regular rhythm. Grossly normal heart sounds.  Good peripheral circulation. Respiratory: Increased respiratory effort.  No retractions. Lungs CTAB and moving good air Gastrointestinal: Soft nontender Musculoskeletal: No lower extremity edema   Neurologic:  Normal speech and language. No gross focal neurologic deficits are appreciated. Skin: Spider bite to dorsal right wrist.  Diffusely diaphoretic Psychiatric: Anxious appearing    ____________________________________________   DIFFERENTIAL includes but not limited to  Black widow bite, acute coronary syndrome, dehydration, appendicitis ____________________________________________   LABS (all labs ordered are listed, but only abnormal results are displayed)  Labs Reviewed  COMPREHENSIVE METABOLIC PANEL - Abnormal; Notable for the following components:      Result Value   Glucose, Bld 112 (*)    BUN 23 (*)    All other components within normal limits  BASIC METABOLIC PANEL - Abnormal; Notable for the following components:   Calcium 8.5 (*)    All other components within normal limits  BASIC METABOLIC PANEL - Abnormal; Notable for the following components:   Calcium 8.3 (*)    All other components  within normal limits  TROPONIN I  CBC WITH DIFFERENTIAL/PLATELET  CK  HIV ANTIBODY (ROUTINE TESTING)  CK  CBC  CBC    Lab work reviewed by me with no acute disease __________________________________________  EKG   ____________________________________________  RADIOLOGY   ____________________________________________   PROCEDURES  Procedure(s) performed: no  Procedures  Critical Care performed: no  ____________________________________________   INITIAL IMPRESSION / ASSESSMENT AND PLAN / ED COURSE  Pertinent labs & imaging results that were available during my care of the patient were reviewed by me and considered in my medical decision making (see chart for details).        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  I have given her 2 mg of IV Ativan along with 4 mg of IV Zofran with some improvement in her symptoms.  I have a call  out to [facility] control to discuss the possible utility of antivenom.  I discussed with the patient that there are no known deaths for black widow toxicity in the [place] in the antivenin has about a 25% chance of causing an allergic reaction and has caused deaths in the past.  She would prefer to try the antivenom which I think is reasonable.  I have a call out the pharmacy now to see if we have any antivenom available. ____________________________________________  ----------------------------------------- 12:01 AM on 12/15/2017 -----------------------------------------  Unfortunately we do not have any antivenom nor does [facility].  At this point given the patient's severe symptoms she requires inpatient admission for continued IV morphine, IV fluids, and IV benzodiazepines and for close monitoring.  FINAL CLINICAL IMPRESSION(S) / ED DIAGNOSES  Final diagnoses:  Poisoning by  black widow spider bite, accidental or unintentional, subsequent encounter      NEW MEDICATIONS STARTED DURING THIS VISIT:  Discharge Medication List as of 12/17/2017 10:39 AM    START taking these medications   Details  cyclobenzaprine (FLEXERIL) 5 MG tablet Take 1 tablet (5 mg total) by mouth 3 (three) times daily as needed for muscle spasms., Starting Fri 12/16/2017, Print    ondansetron (ZOFRAN-ODT) 4 MG disintegrating tablet Take 1 tablet (4 mg total) by mouth 3 (three) times daily before meals., Starting Fri 12/16/2017, Print    oxyCODONE (OXYCONTIN) 10 mg 12 hr tablet Take 1 tablet (10 mg total) by mouth every 12 (twelve) hours., Starting Fri 12/16/2017, Print         Note:  This document was prepared using Dragon voice recognition software and may include unintentional dictation errors.     Darel Hong, MD 12/18/17 [phone]

## 2017-12-14 NOTE — ED Triage Notes (Signed)
Pt reports she was seen in ED on 6/25 for spider bite tot he right arm. Pt back today due to increase in pain at the area and the arm. Pt also having pain in the right leg. Ice pack applied to the area.

## 2017-12-15 ENCOUNTER — Other Ambulatory Visit: Payer: Self-pay

## 2017-12-15 ENCOUNTER — Encounter: Payer: Self-pay | Admitting: Internal Medicine

## 2017-12-15 DIAGNOSIS — T63311A Toxic effect of venom of black widow spider, accidental (unintentional), initial encounter: Secondary | ICD-10-CM | POA: Diagnosis present

## 2017-12-15 DIAGNOSIS — F418 Other specified anxiety disorders: Secondary | ICD-10-CM | POA: Diagnosis present

## 2017-12-15 LAB — CBC WITH DIFFERENTIAL/PLATELET
BASOS ABS: 0.1 10*3/uL (ref 0–0.1)
BASOS PCT: 1 %
EOS PCT: 3 %
Eosinophils Absolute: 0.2 10*3/uL (ref 0–0.7)
HCT: 40.1 % (ref 35.0–47.0)
Hemoglobin: 13.8 g/dL (ref 12.0–16.0)
LYMPHS PCT: 35 %
Lymphs Abs: 2.6 10*3/uL (ref 1.0–3.6)
MCH: 29.5 pg (ref 26.0–34.0)
MCHC: 34.4 g/dL (ref 32.0–36.0)
MCV: 85.8 fL (ref 80.0–100.0)
MONO ABS: 0.4 10*3/uL (ref 0.2–0.9)
Monocytes Relative: 6 %
Neutro Abs: 4.1 10*3/uL (ref 1.4–6.5)
Neutrophils Relative %: 55 %
Platelets: 263 10*3/uL (ref 150–440)
RBC: 4.68 MIL/uL (ref 3.80–5.20)
RDW: 13 % (ref 11.5–14.5)
WBC: 7.4 10*3/uL (ref 3.6–11.0)

## 2017-12-15 LAB — COMPREHENSIVE METABOLIC PANEL
ALT: 19 U/L (ref 0–44)
AST: 25 U/L (ref 15–41)
Albumin: 4.2 g/dL (ref 3.5–5.0)
Alkaline Phosphatase: 70 U/L (ref 38–126)
Anion gap: 10 (ref 5–15)
BILIRUBIN TOTAL: 0.3 mg/dL (ref 0.3–1.2)
BUN: 23 mg/dL — AB (ref 6–20)
CHLORIDE: 102 mmol/L (ref 98–111)
CO2: 26 mmol/L (ref 22–32)
CREATININE: 0.75 mg/dL (ref 0.44–1.00)
Calcium: 9.4 mg/dL (ref 8.9–10.3)
GFR calc non Af Amer: >60 mL/min (ref >60)
Glucose, Bld: 112 mg/dL — ABNORMAL HIGH (ref 70–99)
POTASSIUM: 3.6 mmol/L (ref 3.5–5.1)
Sodium: 138 mmol/L (ref 135–145)
TOTAL PROTEIN: 7.5 g/dL (ref 6.5–8.1)

## 2017-12-15 LAB — TROPONIN I: Troponin I: <0.03 ng/mL (ref <0.03)

## 2017-12-15 LAB — CK: CK TOTAL: 57 U/L (ref 38–234)

## 2017-12-15 MED ORDER — ONDANSETRON HCL 4 MG/2ML IJ SOLN
4.0000 mg | Freq: Once | INTRAMUSCULAR | Status: AC
Start: 2017-12-15 — End: 2017-12-15
  Administered 2017-12-15: 4 mg via INTRAVENOUS

## 2017-12-15 MED ORDER — ONDANSETRON HCL 4 MG PO TABS
4.0000 mg | ORAL_TABLET | Freq: Three times a day (TID) | ORAL | Status: DC
  Administered 2017-12-15: 4 mg via ORAL
  Filled 2017-12-15: qty 1

## 2017-12-15 MED ORDER — AMITRIPTYLINE HCL 10 MG PO TABS
10.0000 mg | ORAL_TABLET | Freq: Every day | ORAL | Status: DC
  Administered 2017-12-15 – 2017-12-16 (×2): 10 mg via ORAL
  Filled 2017-12-15 (×3): qty 1

## 2017-12-15 MED ORDER — ACETAMINOPHEN 325 MG PO TABS
650.0000 mg | ORAL_TABLET | Freq: Four times a day (QID) | ORAL | Status: DC | PRN
  Filled 2017-12-15: qty 2

## 2017-12-15 MED ORDER — MORPHINE SULFATE (PF) 4 MG/ML IV SOLN: 4.0000 mg | INTRAVENOUS | Status: DC | PRN

## 2017-12-15 MED ORDER — ONDANSETRON HCL 4 MG/2ML IJ SOLN
INTRAMUSCULAR | Status: AC
  Filled 2017-12-15: qty 2

## 2017-12-15 MED ORDER — ENOXAPARIN SODIUM 40 MG/0.4ML ~~LOC~~ SOLN
40.0000 mg | SUBCUTANEOUS | Status: DC
  Administered 2017-12-15 – 2017-12-16 (×2): 40 mg via SUBCUTANEOUS
  Filled 2017-12-15 (×2): qty 0.4

## 2017-12-15 MED ORDER — LORAZEPAM 2 MG/ML IJ SOLN: 1.0000 mg | Freq: Four times a day (QID) | INTRAMUSCULAR | Status: DC | PRN

## 2017-12-15 MED ORDER — OXYCODONE HCL 5 MG PO TABS
5.0000 mg | ORAL_TABLET | ORAL | Status: DC | PRN
  Administered 2017-12-15: 5 mg via ORAL
  Filled 2017-12-15: qty 1

## 2017-12-15 MED ORDER — PROMETHAZINE HCL 25 MG/ML IJ SOLN
25.0000 mg | Freq: Four times a day (QID) | INTRAMUSCULAR | Status: DC | PRN
  Administered 2017-12-15 – 2017-12-16 (×3): 25 mg via INTRAVENOUS
  Filled 2017-12-15 (×3): qty 1

## 2017-12-15 MED ORDER — OXYCODONE HCL ER 10 MG PO T12A
10.0000 mg | EXTENDED_RELEASE_TABLET | Freq: Two times a day (BID) | ORAL | Status: DC
  Administered 2017-12-15 – 2017-12-17 (×5): 10 mg via ORAL
  Filled 2017-12-15 (×5): qty 1

## 2017-12-15 MED ORDER — ONDANSETRON HCL 4 MG PO TABS: 4.0000 mg | ORAL_TABLET | Freq: Four times a day (QID) | ORAL | Status: DC | PRN

## 2017-12-15 MED ORDER — SODIUM CHLORIDE 0.9 % IV SOLN
INTRAVENOUS | Status: DC
  Administered 2017-12-15 – 2017-12-17 (×4): via INTRAVENOUS

## 2017-12-15 MED ORDER — BUPROPION HCL ER (SR) 150 MG PO TB12
150.0000 mg | ORAL_TABLET | Freq: Two times a day (BID) | ORAL | Status: DC
  Administered 2017-12-15 – 2017-12-17 (×5): 150 mg via ORAL
  Filled 2017-12-15 (×6): qty 1

## 2017-12-15 MED ORDER — ACETAMINOPHEN 650 MG RE SUPP: 650.0000 mg | Freq: Four times a day (QID) | RECTAL | Status: DC | PRN

## 2017-12-15 MED ORDER — ONDANSETRON HCL 4 MG/2ML IJ SOLN
4.0000 mg | Freq: Three times a day (TID) | INTRAMUSCULAR | Status: DC
  Administered 2017-12-15: 4 mg via INTRAVENOUS
  Filled 2017-12-15: qty 2

## 2017-12-15 MED ORDER — CYCLOBENZAPRINE HCL 10 MG PO TABS: 5.0000 mg | ORAL_TABLET | Freq: Three times a day (TID) | ORAL | Status: DC | PRN

## 2017-12-15 MED ORDER — ONDANSETRON HCL 4 MG/2ML IJ SOLN
4.0000 mg | Freq: Four times a day (QID) | INTRAMUSCULAR | Status: DC | PRN
  Administered 2017-12-15: 4 mg via INTRAVENOUS
  Filled 2017-12-15: qty 2

## 2017-12-15 NOTE — Progress Notes (Signed)
[place] at [place] NAME: Joann Brown    MR#:  [id number]  DATE OF BIRTH:  1965-06-26  SUBJECTIVE:  CHIEF COMPLAINT:   Chief Complaint  Patient presents with  . Insect Bite    had black widow spider bite and came with persistent Right arm, leg and chest pain. Also have nausea.  REVIEW OF SYSTEMS:   CONSTITUTIONAL: No fever, fatigue or weakness.  EYES: No blurred or double vision.  EARS, NOSE, AND THROAT: No tinnitus or ear pain.  RESPIRATORY: No cough, shortness of breath, wheezing or hemoptysis.  CARDIOVASCULAR: No chest pain, orthopnea, edema.  GASTROINTESTINAL: No nausea, vomiting, diarrhea or abdominal pain.  GENITOURINARY: No dysuria, hematuria.  ENDOCRINE: No polyuria, nocturia,  HEMATOLOGY: No anemia, easy bruising or bleeding SKIN: No rash or lesion. MUSCULOSKELETAL: No joint pain or arthritis.   NEUROLOGIC: No tingling, numbness, weakness.  PSYCHIATRY: No anxiety or depression.   ROS  DRUG ALLERGIES:   Allergies  Allergen Reactions  . Latex   . Morphine And Related   . Percocet [Oxycodone-Acetaminophen]     VITALS:  Blood pressure 122/66, pulse 73, temperature 98 F (36.7 C), temperature source Oral, resp. rate 18, height 4\' 11"  (1.499 m), weight 70.5 kg (155 lb 6.8 oz), SpO2 96 %.  PHYSICAL EXAMINATION:  GENERAL:  52 y.o.-year-old patient lying in the bed with no acute distress.  EYES: Pupils equal, round, reactive to light and accommodation. No scleral icterus. Extraocular muscles intact.  HEENT: Head atraumatic, normocephalic. Oropharynx and nasopharynx clear.  NECK:  Supple, no jugular venous distention. No thyroid enlargement, no tenderness.  LUNGS: Normal breath sounds bilaterally, no wheezing, rales,rhonchi or crepitation. No use of accessory muscles of respiration.  CARDIOVASCULAR: S1, S2 normal. No murmurs, rubs, or gallops.  ABDOMEN: Soft, nontender, nondistended. Bowel sounds present. No  organomegaly or mass.  EXTREMITIES: No pedal edema, cyanosis, or clubbing.  NEUROLOGIC: Cranial nerves II through XII are intact. Muscle strength 5/5 in all extremities. Sensation intact. Gait not checked.  PSYCHIATRIC: The patient is alert and oriented x 3.  SKIN: No obvious rash, lesion, or ulcer.   Physical Exam LABORATORY PANEL:   CBC Recent Labs  Lab 12/14/17 2329  WBC 7.4  HGB 13.8  HCT 40.1  PLT 263   ------------------------------------------------------------------------------------------------------------------  Chemistries  Recent Labs  Lab 12/14/17 2329  NA 138  K 3.6  CL 102  CO2 26  GLUCOSE 112*  BUN 23*  CREATININE 0.75  CALCIUM 9.4  AST 25  ALT 19  ALKPHOS 70  BILITOT 0.3   ------------------------------------------------------------------------------------------------------------------  Cardiac Enzymes Recent Labs  Lab 12/13/17 1342 12/14/17 2329  TROPONINI <0.03 <0.03   ------------------------------------------------------------------------------------------------------------------  RADIOLOGY:  No results found.  ASSESSMENT AND PLAN:   Principal Problem:   Black widow spider bite Active Problems:   Depression with anxiety  * Black widow spider bite -   for symptom control including as needed narcotics, benzos, antiemetics.   Will add scheduled pain meds and muscle relaxors.  * nausea    As above PRN antiemetics.  *  Depression with anxiety -continue home meds   All the records are reviewed and case discussed with Care Management/Social Workerr. Management plans discussed with the patient, family and they are in agreement.  CODE STATUS: Full.  TOTAL TIME TAKING CARE OF THIS PATIENT: 35 minutes.     POSSIBLE D/C IN 1-2 DAYS, DEPENDING ON CLINICAL CONDITION.   Vaughan Basta M.D on 12/15/2017   Between  7am to 6pm - Pager - [phone]  After 6pm go to [email or web address] - password [email or web address]  [facility]  Office  [phone]  CC: Primary care physician; [facility], [place]  Note: This dictation was prepared with Diplomatic Services operational officer dictation along with smaller phrase technology. Any transcriptional errors that result from this process are unintentional.

## 2017-12-15 NOTE — H&P (Signed)
[place] at [place] NAME: Joann Brown    MR#:  [id number]  DATE OF BIRTH:  June 02, 1966  DATE OF ADMISSION:  12/14/2017  PRIMARY CARE PHYSICIAN: [facility] [place]   REQUESTING/REFERRING PHYSICIAN: Mable Paris, MD  CHIEF COMPLAINT:   Chief Complaint  Patient presents with  . Insect Bite    HISTORY OF PRESENT ILLNESS:  Joann Brown  is a 52 y.o. female who presents with significant symptoms of black widow spider bite.  Patient was bitten yesterday on her wrist and seen in our ED and given narcotics and benzos.  She returns today with out much improvement in her symptoms.  She has significant abdominal cramping, muscle cramping, diaphoresis, and pain.  Hospitalist called for admission for control of symptoms  PAST MEDICAL HISTORY:   Past Medical History:  Diagnosis Date  . Anxiety   . Cancer [facility]) 2007    hysterectomy to remove tumor in uterus   . Depression      PAST SURGICAL HISTORY:   Past Surgical History:  Procedure Laterality Date  . ABDOMINAL HYSTERECTOMY     2/2 tumor in uterus   . APPENDECTOMY  1986  . BRAIN SURGERY  2014   coil placement for brain aneurysm      SOCIAL HISTORY:   Social History   Tobacco Use  . Smoking status: Never Smoker  . Smokeless tobacco: Never Used  Substance Use Topics  . Alcohol use: Not Currently     FAMILY HISTORY:   Family History  Problem Relation Age of Onset  . Heart disease Father   . Heart disease Maternal Grandfather   . Heart disease Paternal Grandfather      DRUG ALLERGIES:   Allergies  Allergen Reactions  . Latex   . Morphine And Related   . Percocet [Oxycodone-Acetaminophen]     MEDICATIONS AT HOME:   Prior to Admission medications   Medication Sig Start Date End Date Taking? Authorizing Provider  diazepam (VALIUM) 5 MG tablet Take 1 tablet (5 mg total) by mouth every 8 (eight) hours as needed for muscle  spasms. 12/13/17  Yes Earleen Newport, MD  HYDROcodone-acetaminophen (NORCO/VICODIN) 5-325 MG tablet Take 1 tablet by mouth 3 (three) times daily as needed for moderate pain. 12/13/17 12/13/18 Yes Earleen Newport, MD  ALBUTEROL IN Inhale into the lungs.    [provider]  amitriptyline (ELAVIL) 10 MG tablet Take 10 mg by mouth at bedtime.    [provider]  buPROPion (ZYBAN) 150 MG 12 hr tablet Take 150 mg by mouth 2 (two) times daily.    [provider]  cholecalciferol (VITAMIN D) 400 units TABS tablet Take 400 Units by mouth.    [provider]  Insulin Lispro (ADMELOG [place]) Inject into the skin.    [provider]    REVIEW OF SYSTEMS:  Review of Systems  Constitutional: Positive for diaphoresis and malaise/fatigue. Negative for chills, fever and weight loss.  HENT: Negative for ear pain, hearing loss and tinnitus.   Eyes: Negative for blurred vision, double vision, pain and redness.  Respiratory: Negative for cough, hemoptysis and shortness of breath.   Cardiovascular: Negative for chest pain, palpitations, orthopnea and leg swelling.  Gastrointestinal: Positive for abdominal pain and nausea. Negative for constipation, diarrhea and vomiting.  Genitourinary: Negative for dysuria, frequency and hematuria.  Musculoskeletal: Positive for myalgias. Negative for back pain, joint pain and neck pain.  Cramping  Skin:       No acne, rash, or lesions  Neurological: Negative for dizziness, tremors, focal weakness and weakness.  Endo/Heme/Allergies: Negative for polydipsia. Does not bruise/bleed easily.  Psychiatric/Behavioral: Negative for depression. The patient is not nervous/anxious and does not have insomnia.      VITAL SIGNS:   Vitals:   12/14/17 2250 12/14/17 2330 12/15/17 0000 12/15/17 0030  BP:   121/67 126/80  Pulse:   75 74  Resp:   (!) 28 15  Temp:      TempSrc:      SpO2:  97% 90% 96%  Weight: 66.2 kg (146 lb)       Wt Readings from Last 3 Encounters:  12/14/17 66.2 kg (146 lb)  12/13/17 66.2 kg (146 lb)    PHYSICAL EXAMINATION:  Physical Exam  Vitals reviewed. Constitutional: She is oriented to person, place, and time. She appears well-developed and well-nourished. No distress.  HENT:  Head: Normocephalic and atraumatic.  Mouth/Throat: Oropharynx is clear and moist.  Eyes: Pupils are equal, round, and reactive to light. Conjunctivae and EOM are normal. No scleral icterus.  Neck: Normal range of motion. Neck supple. No JVD present. No thyromegaly present.  Cardiovascular: Normal rate, regular rhythm and intact distal pulses. Exam reveals no gallop and no friction rub.  No murmur heard. Respiratory: Effort normal and breath sounds normal. No respiratory distress. She has no wheezes. She has no rales.  GI: Soft. Bowel sounds are normal. She exhibits no distension. There is no tenderness.  Musculoskeletal: Normal range of motion. She exhibits no edema.  No arthritis, no gout  Lymphadenopathy:    She has no cervical adenopathy.  Neurological: She is alert and oriented to person, place, and time. No cranial nerve deficit.  No dysarthria, no aphasia  Skin: Skin is warm and dry. No rash noted. No erythema.  Psychiatric: She has a normal mood and affect. Her behavior is normal. Judgment and thought content normal.    LABORATORY PANEL:   CBC Recent Labs  Lab 12/14/17 2329  WBC 7.4  HGB 13.8  HCT 40.1  PLT 263   ------------------------------------------------------------------------------------------------------------------  Chemistries  Recent Labs  Lab 12/14/17 2329  NA 138  K 3.6  CL 102  CO2 26  GLUCOSE 112*  BUN 23*  CREATININE 0.75  CALCIUM 9.4  AST 25  ALT 19  ALKPHOS 70  BILITOT 0.3   ------------------------------------------------------------------------------------------------------------------  Cardiac Enzymes Recent Labs  Lab 12/14/17 2329  TROPONINI <0.03    ------------------------------------------------------------------------------------------------------------------  RADIOLOGY:  No results found.  EKG:  No orders found for this or any previous visit.  IMPRESSION AND PLAN:  Principal Problem:   Black widow spider bite -admit the patient for symptom control including as needed narcotics, benzos, antiemetics. Active Problems:   Depression with anxiety -continue home meds  Chart review performed and case discussed with ED provider. Labs, imaging and/or ECG reviewed by provider and discussed with patient/family. Management plans discussed with the patient and/or family.  DVT PROPHYLAXIS: SubQ lovenox  GI PROPHYLAXIS: None  ADMISSION STATUS: Observation  CODE STATUS: Full  TOTAL TIME TAKING CARE OF THIS PATIENT: 40 minutes.   Kianah Harries [place] 12/15/2017, 1:15 AM  [employer]  [phone]  CC: Primary care physician; [facility], [place]  Note:  This document was prepared using Systems analyst and may include unintentional dictation errors.

## 2017-12-15 NOTE — ED Notes (Signed)
Patient is diaphoretic and has abd cramping since yesterday when bit by black widow spider.

## 2017-12-16 LAB — HIV ANTIBODY (ROUTINE TESTING W REFLEX): HIV SCREEN 4TH GENERATION: NONREACTIVE

## 2017-12-16 LAB — CBC
HEMATOCRIT: 39.4 % (ref 35.0–47.0)
HEMOGLOBIN: 13.5 g/dL (ref 12.0–16.0)
MCH: 29.9 pg (ref 26.0–34.0)
MCHC: 34.3 g/dL (ref 32.0–36.0)
MCV: 87.1 fL (ref 80.0–100.0)
Platelets: 241 10*3/uL (ref 150–440)
RBC: 4.53 MIL/uL (ref 3.80–5.20)
RDW: 13.1 % (ref 11.5–14.5)
WBC: 7.5 10*3/uL (ref 3.6–11.0)

## 2017-12-16 LAB — BASIC METABOLIC PANEL
Anion gap: 10 (ref 5–15)
BUN: 14 mg/dL (ref 6–20)
CO2: 23 mmol/L (ref 22–32)
Calcium: 8.5 mg/dL — ABNORMAL LOW (ref 8.9–10.3)
Chloride: 105 mmol/L (ref 98–111)
Creatinine, Ser: 0.6 mg/dL (ref 0.44–1.00)
GFR calc Af Amer: >60 mL/min (ref >60)
GLUCOSE: 83 mg/dL (ref 70–99)
POTASSIUM: 4 mmol/L (ref 3.5–5.1)
Sodium: 138 mmol/L (ref 135–145)

## 2017-12-16 LAB — CK: CK TOTAL: 44 U/L (ref 38–234)

## 2017-12-16 MED ORDER — DIAZEPAM 2 MG PO TABS
2.0000 mg | ORAL_TABLET | Freq: Two times a day (BID) | ORAL | Status: DC
  Administered 2017-12-16 – 2017-12-17 (×2): 2 mg via ORAL
  Filled 2017-12-16 (×2): qty 1

## 2017-12-16 MED ORDER — OXYCODONE HCL ER 10 MG PO T12A: 10.0000 mg | EXTENDED_RELEASE_TABLET | Freq: Two times a day (BID) | ORAL | 0 refills | Status: DC

## 2017-12-16 MED ORDER — HYDROCODONE-ACETAMINOPHEN 5-325 MG PO TABS: 1.0000 | ORAL_TABLET | Freq: Three times a day (TID) | ORAL | 0 refills | Status: DC | PRN

## 2017-12-16 MED ORDER — ONDANSETRON 4 MG PO TBDP
4.0000 mg | ORAL_TABLET | Freq: Three times a day (TID) | ORAL | Status: DC
  Administered 2017-12-16 (×2): 4 mg via ORAL
  Filled 2017-12-16 (×6): qty 1

## 2017-12-16 MED ORDER — CYCLOBENZAPRINE HCL 5 MG PO TABS: 5.0000 mg | ORAL_TABLET | Freq: Three times a day (TID) | ORAL | 0 refills | Status: DC | PRN

## 2017-12-16 MED ORDER — ONDANSETRON 4 MG PO TBDP: 4.0000 mg | ORAL_TABLET | Freq: Three times a day (TID) | ORAL | 0 refills | Status: DC

## 2017-12-16 MED ORDER — MECLIZINE HCL 12.5 MG PO TABS
12.5000 mg | ORAL_TABLET | Freq: Three times a day (TID) | ORAL | Status: DC | PRN
  Administered 2017-12-16 (×2): 12.5 mg via ORAL
  Filled 2017-12-16 (×3): qty 1

## 2017-12-16 NOTE — Progress Notes (Signed)
[name] at [place] NAME: Joann Brown    MR#:  [id number]  DATE OF BIRTH:  25-Feb-1966  SUBJECTIVE:  CHIEF COMPLAINT:   Chief Complaint  Patient presents with  . Insect Bite    had black widow spider bite and came with persistent Right arm, leg and chest pain. Also have nausea.  was feeling comfortable in the morning so I decided to discharge her, but after having lunch started having severe nausea and vomiting with dzziness and feeling weak again.  REVIEW OF SYSTEMS:   CONSTITUTIONAL: No fever,positive for  fatigue or weakness.  EYES: No blurred or double vision.  EARS, NOSE, AND THROAT: No tinnitus or ear pain.  RESPIRATORY: No cough, shortness of breath, wheezing or hemoptysis.  CARDIOVASCULAR: No chest pain, orthopnea, edema.  GASTROINTESTINAL: have nausea, vomiting,no diarrhea or abdominal pain.  GENITOURINARY: No dysuria, hematuria.  ENDOCRINE: No polyuria, nocturia,  HEMATOLOGY: No anemia, easy bruising or bleeding SKIN: No rash or lesion. MUSCULOSKELETAL: No joint pain or arthritis.   NEUROLOGIC: No tingling, numbness, weakness.  PSYCHIATRY: No anxiety or depression.   ROS  DRUG ALLERGIES:   Allergies  Allergen Reactions  . Latex   . Morphine And Related   . Percocet [Oxycodone-Acetaminophen]     VITALS:  Blood pressure 112/67, pulse 77, temperature 98 F (36.7 C), temperature source Oral, resp. rate 18, height 4\' 11"  (1.499 m), weight 70.5 kg (155 lb 6.8 oz), SpO2 100 %.  PHYSICAL EXAMINATION:  GENERAL:  52 y.o.-year-old patient lying in the bed with no acute distress.  EYES: Pupils equal, round, reactive to light and accommodation. No scleral icterus. Extraocular muscles intact.  HEENT: Head atraumatic, normocephalic. Oropharynx and nasopharynx clear.  NECK:  Supple, no jugular venous distention. No thyroid enlargement, no tenderness.  LUNGS: Normal breath sounds bilaterally, no wheezing, rales,rhonchi  or crepitation. No use of accessory muscles of respiration.  CARDIOVASCULAR: S1, S2 normal. No murmurs, rubs, or gallops.  ABDOMEN: Soft, nontender, nondistended. Bowel sounds present. No organomegaly or mass.  EXTREMITIES: No pedal edema, cyanosis, or clubbing.  NEUROLOGIC: Cranial nerves II through XII are intact. Muscle strength 5/5 in all extremities. Sensation intact. Gait not checked.  PSYCHIATRIC: The patient is alert and oriented x 3.  SKIN: No obvious rash, lesion, or ulcer.   Physical Exam LABORATORY PANEL:   CBC Recent Labs  Lab 12/16/17 0725  WBC 7.5  HGB 13.5  HCT 39.4  PLT 241   ------------------------------------------------------------------------------------------------------------------  Chemistries  Recent Labs  Lab 12/14/17 2329 12/16/17 0628  NA 138 138  K 3.6 4.0  CL 102 105  CO2 26 23  GLUCOSE 112* 83  BUN 23* 14  CREATININE 0.75 0.60  CALCIUM 9.4 8.5*  AST 25  --   ALT 19  --   ALKPHOS 70  --   BILITOT 0.3  --    ------------------------------------------------------------------------------------------------------------------  Cardiac Enzymes Recent Labs  Lab 12/13/17 1342 12/14/17 2329  TROPONINI <0.03 <0.03   ------------------------------------------------------------------------------------------------------------------  RADIOLOGY:  No results found.  ASSESSMENT AND PLAN:   Principal Problem:   Black widow spider bite Active Problems:   Depression with anxiety  * Black widow spider bite - c/o right sided upper and lower extremity pain, abd and chest pain on right side.   for symptom control including as needed narcotics, benzos, antiemetics.   Will add scheduled pain meds and muscle relaxors.  * nausea    As above PRN antiemetics.   Scheduled meds  before meals.  *  Depression with anxiety -continue home meds  * Dizziness    IV fluids, Check ortho statics   Meclizine.   All the records are reviewed and case  discussed with Care Management/Social Workerr. Management plans discussed with the patient, family and they are in agreement.  CODE STATUS: Full.  TOTAL TIME TAKING CARE OF THIS PATIENT: 35 minutes.     POSSIBLE D/C IN 1-2 DAYS, DEPENDING ON CLINICAL CONDITION.   Vaughan Basta M.D on 12/16/2017   Between 7am to 6pm - Pager - [phone]  After 6pm go to [email or web address] - password [email or web address] [place] [facility]  Office  [phone]  CC: Primary care physician; [facility], [place]  Note: This dictation was prepared with Diplomatic Services operational officer dictation along with smaller phrase technology. Any transcriptional errors that result from this process are unintentional.

## 2017-12-16 NOTE — Progress Notes (Signed)
Patient c/o dizziness and increased nausea after lunch. Patient is concerned with discharging home today. Attending MD made aware. See new orders. Madlyn Frankel, RN

## 2017-12-16 NOTE — Discharge Summary (Addendum)
[facility] [place] NAME: Joann Brown    MR#:  [id number]  DATE OF BIRTH:  1966/03/31  DATE OF ADMISSION:  12/14/2017 ADMITTING PHYSICIAN: Lance Coon, MD  DATE OF DISCHARGE: 12/17/2017   PRIMARY CARE PHYSICIAN: [facility] [place]    ADMISSION DIAGNOSIS:  Poisoning by black widow spider bite, accidental or unintentional, subsequent encounter [W09.811B]  DISCHARGE DIAGNOSIS:  Principal Problem:   Black widow spider bite Active Problems:   Depression with anxiety   SECONDARY DIAGNOSIS:   Past Medical History:  Diagnosis Date  . Anxiety   . Cancer [facility]) 2007    hysterectomy to remove tumor in uterus   . Depression     HOSPITAL COURSE:   *Black widow spider bite -   for symptom control including as needed narcotics, benzos, antiemetics.   add scheduled pain meds and muscle relaxors.   Helped and pt is able to control pain and nausea reasonably- d/c home.  * nausea    As above PRN antiemetics.  *Depression with anxiety -continue home meds   ( her morning labs today including BMP and CK are still showing " in process" for last > 2 hrs, but pt symptomatically much improved, so discharge home)  DISCHARGE CONDITIONS:   Stable.  CONSULTS OBTAINED:    DRUG ALLERGIES:   Allergies  Allergen Reactions  . Latex   . Morphine And Related   . Percocet [Oxycodone-Acetaminophen]     DISCHARGE MEDICATIONS:   Allergies as of 12/17/2017      Reactions   Latex    Morphine And Related    Percocet [oxycodone-acetaminophen]       Medication List    TAKE these medications   ADMELOG [place] Inject into the skin.   ALBUTEROL IN Inhale into the lungs.   amitriptyline 10 MG tablet Commonly known as:  ELAVIL Take 10 mg by mouth at bedtime.   buPROPion 150 MG 12 hr tablet Commonly known as:  ZYBAN Take 150 mg by mouth 2 (two) times daily.   cholecalciferol 400 units Tabs  tablet Commonly known as:  VITAMIN D Take 400 Units by mouth.   cyclobenzaprine 5 MG tablet Commonly known as:  FLEXERIL Take 1 tablet (5 mg total) by mouth 3 (three) times daily as needed for muscle spasms.   diazepam 5 MG tablet Commonly known as:  VALIUM Take 1 tablet (5 mg total) by mouth every 8 (eight) hours as needed for muscle spasms.   HYDROcodone-acetaminophen 5-325 MG tablet Commonly known as:  NORCO/VICODIN Take 1 tablet by mouth 3 (three) times daily as needed for moderate pain.   ondansetron 4 MG disintegrating tablet Commonly known as:  ZOFRAN-ODT Take 1 tablet (4 mg total) by mouth 3 (three) times daily before meals.   oxyCODONE 10 mg 12 hr tablet Commonly known as:  OXYCONTIN Take 1 tablet (10 mg total) by mouth every 12 (twelve) hours.        DISCHARGE INSTRUCTIONS:    Follow with PMD>  If you experience worsening of your admission symptoms, develop shortness of breath, life threatening emergency, suicidal or homicidal thoughts you must seek medical attention immediately by calling 911 or calling your MD immediately  if symptoms less severe.  You Must read complete instructions/literature along with all the possible adverse reactions/side effects for all the Medicines you take and that have been prescribed to you. Take any new Medicines after you have completely understood and accept all  the possible adverse reactions/side effects.   Please note  You were cared for by a hospitalist during your hospital stay. If you have any questions about your discharge medications or the care you received while you were in the hospital after you are discharged, you can call the unit and asked to speak with the hospitalist on call if the hospitalist that took care of you is not available. Once you are discharged, your primary care physician will handle any further medical issues. Please note that NO REFILLS for any discharge medications will be authorized once you are  discharged, as it is imperative that you return to your primary care physician (or establish a relationship with a primary care physician if you do not have one) for your aftercare needs so that they can reassess your need for medications and monitor your lab values.    Today   CHIEF COMPLAINT:   Chief Complaint  Patient presents with  . Insect Bite    HISTORY OF PRESENT ILLNESS:  Joann Brown  is a 52 y.o. female presents with significant symptoms of black widow spider bite.  Patient was bitten yesterday on her wrist and seen in our ED and given narcotics and benzos.  She returns today with out much improvement in her symptoms.  She has significant abdominal cramping, muscle cramping, diaphoresis, and pain.  Hospitalist called for admission for control of symptoms   VITAL SIGNS:  Blood pressure 111/60, pulse 76, temperature 98.2 F (36.8 C), temperature source Oral, resp. rate 18, height 4\' 11"  (1.499 m), weight 70.5 kg (155 lb 6.8 oz), SpO2 98 %.  I/O:    Intake/Output Summary (Last 24 hours) at 12/17/2017 1422 Last data filed at 12/16/2017 1538 Gross per 24 hour  Intake 641.25 ml  Output -  Net 641.25 ml    PHYSICAL EXAMINATION:  GENERAL:  52 y.o.-year-old patient lying in the bed with no acute distress.  EYES: Pupils equal, round, reactive to light and accommodation. No scleral icterus. Extraocular muscles intact.  HEENT: Head atraumatic, normocephalic. Oropharynx and nasopharynx clear.  NECK:  Supple, no jugular venous distention. No thyroid enlargement, no tenderness.  LUNGS: Normal breath sounds bilaterally, no wheezing, rales,rhonchi or crepitation. No use of accessory muscles of respiration.  CARDIOVASCULAR: S1, S2 normal. No murmurs, rubs, or gallops.  ABDOMEN: Soft, non-tender, non-distended. Bowel sounds present. No organomegaly or mass.  EXTREMITIES: No pedal edema, cyanosis, or clubbing.  NEUROLOGIC: Cranial nerves II through XII are intact. Muscle  strength 5/5 in all extremities. Sensation intact. Gait not checked.  PSYCHIATRIC: The patient is alert and oriented x 3.  SKIN: No obvious rash, lesion, or ulcer.   DATA REVIEW:   CBC Recent Labs  Lab 12/17/17 0505  WBC 7.1  HGB 12.4  HCT 35.5  PLT 238    Chemistries  Recent Labs  Lab 12/14/17 2329  12/17/17 0505  NA 138   < > 138  K 3.6   < > 3.8  CL 102   < > 106  CO2 26   < > 26  GLUCOSE 112*   < > 90  BUN 23*   < > 13  CREATININE 0.75   < > 0.63  CALCIUM 9.4   < > 8.3*  AST 25  --   --   ALT 19  --   --   ALKPHOS 70  --   --   BILITOT 0.3  --   --    < > = values  in this interval not displayed.    Cardiac Enzymes Recent Labs  Lab 12/14/17 2329  TROPONINI <0.03    Microbiology Results  No results found for this or any previous visit.  RADIOLOGY:  No results found.  EKG:  No orders found for this or any previous visit.    Management plans discussed with the patient, family and they are in agreement.  CODE STATUS:     Code Status Orders  (From admission, onward)        Start     Ordered   12/15/17 0202  Full code  Continuous     12/15/17 0201    Code Status History    This patient has a current code status but no historical code status.      TOTAL TIME TAKING CARE OF THIS PATIENT: 35 minutes.    Henreitta Leber M.D on 12/17/2017 at 2:22 PM  Between 7am to 6pm - Pager - [phone]  After 6pm go to [email or web address] - password [email or web address] [facility] [facility]  Office  [phone]  CC: Primary care physician; [facility] [place]   Note: This dictation was prepared with Diplomatic Services operational officer dictation along with smaller phrase technology. Any transcriptional errors that result from this process are unintentional.

## 2017-12-17 LAB — CBC
HEMATOCRIT: 35.5 % (ref 35.0–47.0)
HEMOGLOBIN: 12.4 g/dL (ref 12.0–16.0)
MCH: 30 pg (ref 26.0–34.0)
MCHC: 34.8 g/dL (ref 32.0–36.0)
MCV: 86.2 fL (ref 80.0–100.0)
Platelets: 238 10*3/uL (ref 150–440)
RBC: 4.12 MIL/uL (ref 3.80–5.20)
RDW: 12.7 % (ref 11.5–14.5)
WBC: 7.1 10*3/uL (ref 3.6–11.0)

## 2017-12-17 LAB — BASIC METABOLIC PANEL
ANION GAP: 6 (ref 5–15)
BUN: 13 mg/dL (ref 6–20)
CALCIUM: 8.3 mg/dL — AB (ref 8.9–10.3)
CHLORIDE: 106 mmol/L (ref 98–111)
CO2: 26 mmol/L (ref 22–32)
Creatinine, Ser: 0.63 mg/dL (ref 0.44–1.00)
GFR calc non Af Amer: >60 mL/min (ref >60)
Glucose, Bld: 90 mg/dL (ref 70–99)
POTASSIUM: 3.8 mmol/L (ref 3.5–5.1)
Sodium: 138 mmol/L (ref 135–145)

## 2018-02-04 ENCOUNTER — Emergency Department
Admission: EM | Admit: 2018-02-04 | Discharge: 2018-02-04 | Disposition: A | Discharge status: Home / Self Care | Payer: BLUE CROSS/BLUE SHIELD | Attending: Emergency Medicine | Admitting: Emergency Medicine

## 2018-02-04 ENCOUNTER — Emergency Department: Payer: BLUE CROSS/BLUE SHIELD

## 2018-02-04 ENCOUNTER — Encounter: Payer: Self-pay | Admitting: Emergency Medicine

## 2018-02-04 DIAGNOSIS — Z91040 Latex allergy status: Secondary | ICD-10-CM | POA: Diagnosis not present

## 2018-02-04 DIAGNOSIS — Z794 Long term (current) use of insulin: Secondary | ICD-10-CM | POA: Insufficient documentation

## 2018-02-04 DIAGNOSIS — Z8541 Personal history of malignant neoplasm of cervix uteri: Secondary | ICD-10-CM | POA: Diagnosis not present

## 2018-02-04 DIAGNOSIS — T63441A Toxic effect of venom of bees, accidental (unintentional), initial encounter: Secondary | ICD-10-CM | POA: Insufficient documentation

## 2018-02-04 DIAGNOSIS — Z79899 Other long term (current) drug therapy: Secondary | ICD-10-CM | POA: Insufficient documentation

## 2018-02-04 DIAGNOSIS — W57XXXA Bitten or stung by nonvenomous insect and other nonvenomous arthropods, initial encounter: Secondary | ICD-10-CM

## 2018-02-04 DIAGNOSIS — R1110 Vomiting, unspecified: Secondary | ICD-10-CM | POA: Diagnosis not present

## 2018-02-04 DIAGNOSIS — R220 Localized swelling, mass and lump, head: Secondary | ICD-10-CM | POA: Diagnosis present

## 2018-02-04 MED ORDER — MORPHINE SULFATE (PF) 2 MG/ML IV SOLN
2.0000 mg | Freq: Once | INTRAVENOUS | Status: AC
  Administered 2018-02-04: 2 mg via INTRAVENOUS
  Filled 2018-02-04: qty 1

## 2018-02-04 MED ORDER — DEXAMETHASONE SODIUM PHOSPHATE 10 MG/ML IJ SOLN
10.0000 mg | Freq: Once | INTRAMUSCULAR | Status: AC
  Administered 2018-02-04: 10 mg via INTRAVENOUS
  Filled 2018-02-04: qty 1

## 2018-02-04 MED ORDER — ONDANSETRON HCL 4 MG/2ML IJ SOLN
4.0000 mg | INTRAMUSCULAR | Status: AC
  Administered 2018-02-04: 4 mg via INTRAVENOUS
  Filled 2018-02-04: qty 2

## 2018-02-04 NOTE — ED Provider Notes (Signed)
[facility] Emergency Department Provider Note  ____________________________________________   First MD Initiated Contact with Patient 02/04/18 1859     (approximate)  I have reviewed the triage vital signs and the nursing notes.   HISTORY  Chief Complaint Allergic Reaction  The patient and/or family speak(s) Spanish.  They understand they have the right to the use of a hospital interpreter, however at this time they prefer to speak directly with me in [place].  They know that they can ask for an interpreter at any time.   HPI Joann Brown is a 52 y.o. female with no contributory PMH who presents for evaluation of pain and swelling in the mouth and tongue after reportedly being stung on the tongue by a bee or wasp.  She reports that the insect was in her beverage and when she took a drink it landed on her tongue and stung her.  She reports pain in her tongue and a sensation of swelling in her tongue and throat.  She is breathing comfortably but is obviously upset and anxious.  She reports no difficulty breathing but reports some difficulty swallowing.  She has had no wheezing.  She denies recent fever/chills and she denies chest pain.  The paramedics report that she was more anxious earlier and she had some vomiting.  She was given Benadryl 50 mg by IV in route to the hospital.  She is currently anxious and not talking very much due to the pain in her tongue but she is not in acute respiratory distress.  Her symptoms are severe and nothing in particular makes them better or worse although the Benadryl may have improved the situation slightly.  Past Medical History:  Diagnosis Date  . Anxiety   . Cancer [facility]) 2007    hysterectomy to remove tumor in uterus   . Depression     Patient Active Problem List   Diagnosis Date Noted  . Black widow spider bite 12/15/2017  . Depression with anxiety 12/15/2017    Past Surgical History:  Procedure Laterality  Date  . ABDOMINAL HYSTERECTOMY     2/2 tumor in uterus   . APPENDECTOMY  1986  . BRAIN SURGERY  2014   coil placement for brain aneurysm   . TONSILLECTOMY     52 years old    Prior to Admission medications   Medication Sig Start Date End Date Taking? Authorizing Provider  ALBUTEROL IN Inhale into the lungs.    [provider]  amitriptyline (ELAVIL) 10 MG tablet Take 10 mg by mouth at bedtime.    [provider]  buPROPion (ZYBAN) 150 MG 12 hr tablet Take 150 mg by mouth 2 (two) times daily.    [provider]  cholecalciferol (VITAMIN D) 400 units TABS tablet Take 400 Units by mouth.    [provider]  cyclobenzaprine (FLEXERIL) 5 MG tablet Take 1 tablet (5 mg total) by mouth 3 (three) times daily as needed for muscle spasms. 12/16/17   Vaughan Basta, MD  diazepam (VALIUM) 5 MG tablet Take 1 tablet (5 mg total) by mouth every 8 (eight) hours as needed for muscle spasms. 12/13/17   Earleen Newport, MD  HYDROcodone-acetaminophen (NORCO/VICODIN) 5-325 MG tablet Take 1 tablet by mouth 3 (three) times daily as needed for moderate pain. 12/16/17 12/16/18  Vaughan Basta, MD  Insulin Lispro (ADMELOG [place]) Inject into the skin.    [provider]  ondansetron (ZOFRAN-ODT) 4 MG disintegrating tablet Take 1 tablet (4  mg total) by mouth 3 (three) times daily before meals. 12/16/17   Vaughan Basta, MD  oxyCODONE (OXYCONTIN) 10 mg 12 hr tablet Take 1 tablet (10 mg total) by mouth every 12 (twelve) hours. 12/16/17   Vaughan Basta, MD    Allergies Latex; Morphine and related; and Percocet [oxycodone-acetaminophen]  Family History  Problem Relation Age of Onset  . Heart disease Father   . Heart disease Maternal Grandfather   . Heart disease Paternal Grandfather     Social History Social History   Tobacco Use  . Smoking status: Never Smoker  . Smokeless tobacco: Never Used  Substance Use Topics  . Alcohol use: Not  Currently  . Drug use: Not Currently    Review of Systems Constitutional: No fever/chills Eyes: No visual changes. ENT: Swelling of the tongue, pain, and some swelling sensation in her throat. Cardiovascular: Denies chest pain. Respiratory: Denies shortness of breath. Gastrointestinal: No abdominal pain.  No nausea, no vomiting.  No diarrhea.  No constipation. Genitourinary: Negative for dysuria. Musculoskeletal: Negative for neck pain.  Negative for back pain. Integumentary: Negative for rash. Neurological: Negative for headaches, focal weakness or numbness.   ____________________________________________   PHYSICAL EXAM:  VITAL SIGNS: ED Triage Vitals  Enc Vitals Group     BP 02/04/18 1848 (!) 174/90     Pulse Rate 02/04/18 1848 (!) 108     Resp 02/04/18 1848 18     Temp --      Temp src --      SpO2 02/04/18 1848 100 %     Weight 02/04/18 1849 70.5 kg (155 lb 6.8 oz)     Height 02/04/18 1849 1.626 m (5\' 4" )     Head Circumference --      Peak Flow --      Pain Score 02/04/18 1849 4     Pain Loc --      Pain Edu? --      Excl. in [place]? --     Constitutional: Alert and oriented.  Patient is anxious and in moderate distress in general but no respiratory distress. Eyes: Conjunctivae are normal.  Head: Atraumatic. Nose: No congestion/rhinnorhea. Mouth/Throat: Mucous membranes are moist.  Oropharynx non-erythematous.  No obvious swelling to the tongue, no evidence of angioedema, easily visualized tonsillar pillars, no evidence of airway obstruction.  No drooling, tripoding, nor brawny induration under the tongue. Neck: No stridor.  No meningeal signs.   Cardiovascular: Mild sinus tachycardia, regular rhythm. Good peripheral circulation. Grossly normal heart sounds. Respiratory: Normal respiratory effort.  No retractions. Lungs CTAB. Gastrointestinal: Soft and nontender. No distention.  Musculoskeletal: No lower extremity tenderness nor edema. No gross deformities of  extremities. Neurologic: Somewhat slurred speech but it is difficult to appreciate because the patient is essentially refusing to talk.  No other neurological deficits appreciated. Skin:  Skin is warm, dry and intact. No rash noted. Psychiatric: Mood and affect are anxious.  ____________________________________________   LABS (all labs ordered are listed, but only abnormal results are displayed)  Labs Reviewed - No data to display ____________________________________________  EKG  No indication for EKG ____________________________________________  RADIOLOGY I, Hinda Kehr, personally viewed and evaluated these images (plain radiographs) as part of my medical decision making, as well as reviewing the written report by the radiologist.   ED MD interpretation: No abnormalities identified on soft tissue neck x-ray including no evidence of retropharyngeal soft tissue swelling nor epiglottic enlargement  Official radiology report(s): Dg Neck Soft Tissue  Result Date: 02/04/2018  CLINICAL DATA:  52 y/o F; wasp sting. Tongue feels numb and throat hurts. EXAM: NECK SOFT TISSUES - 1+ VIEW COMPARISON:  None. FINDINGS: There is no evidence of retropharyngeal soft tissue swelling or epiglottic enlargement. The cervical airway is unremarkable and no radio-opaque foreign body identified. Moderate discogenic degenerative changes of the cervical spine at the C5-C7 levels. Upper cervical facet arthrosis. IMPRESSION: The cervical airway is unremarkable. Moderate cervical spine spondylosis. Electronically Signed   By: Kristine Garbe M.D.   On: 02/04/2018 19:40    ____________________________________________   PROCEDURES  Critical Care performed: No   Procedure(s) performed:   Procedures   ____________________________________________   INITIAL IMPRESSION / ASSESSMENT AND PLAN / ED COURSE  As part of my medical decision making, I reviewed the following data within the [place] notes reviewed and incorporated and Radiograph reviewed     Differential diagnosis includes, but is not limited to, angioedema secondary either to insect bite or medication side effects, panic attack or anxiety reaction, airway compromise, soft tissue neck infection.  The symptoms began acutely after having an insect landed on her tongue.  I see no sign of obvious angioedema at this time and no sign of infection within her oropharynx.  I think that after the surprise and shock of the insect landing on her tongue and/or an actual stinging incident, she had a panic or anxiety attack on top of the initial contact.  She is received Benadryl 50 mg IV and to try to help with any additional inflammation or swelling she may experience I administered Decadron 10 mg by IV.  She was reporting severe pain so I administered morphine 2 mg IV as well as Zofran 4 mg IV since she had some vomiting but the paramedic even told me she thought it was because the patient was so worked up initially.  After a total of nearly 5 hours of observation in the emergency department she is feeling very well.  She is now speaking with me clearly and coherently with no evidence of any difficulty with her tongue or with speaking.  She is having no airway compromise and I do not feel there is indication for any further intervention.  The Decadron should work for her for the next couple of days and again I do not think based on the initial presentation and no obvious swelling or angioedema that she would benefit from prednisone.  I encouraged continued use of over-the-counter Benadryl as needed and I gave strict return precautions.  She and her husband understand and agree with the plan.      ____________________________________________  FINAL CLINICAL IMPRESSION(S) / ED DIAGNOSES  Final diagnoses:  Insect bite, unspecified site, initial encounter     MEDICATIONS GIVEN DURING THIS VISIT:  Medications    dexamethasone (DECADRON) injection 10 mg (10 mg Intravenous Given 02/04/18 1932)  ondansetron (ZOFRAN) injection 4 mg (4 mg Intravenous Given 02/04/18 1932)  morphine 2 MG/ML injection 2 mg (2 mg Intravenous Given 02/04/18 1932)     ED Discharge Orders    None       Note:  This document was prepared using Dragon voice recognition software and may include unintentional dictation errors.    Hinda Kehr, MD 02/04/18 [phone]

## 2018-02-04 NOTE — ED Notes (Signed)
Family at bedside, pt more cooperative with questions. Pt states her tongue feels numb and her throat hurts.

## 2018-02-04 NOTE — Discharge Instructions (Signed)
Your workup in the Emergency Department today was reassuring.  We did not find any specific abnormalities.  We recommend you drink plenty of fluids, take your regular medications and/or any new ones prescribed today, and follow up with the doctor(s) listed in these documents as recommended.  If you feel like you are having some swelling of your tongue, please take some over-the-counter Benadryl according to label instructions.  Return to the Emergency Department if you develop new or worsening symptoms that concern you.

## 2018-02-04 NOTE — ED Triage Notes (Addendum)
PT arrives via ems after wasp sting. EMS reports being stung on tongue and head.  PT given 50 of benadryl in route. Interpreter used for triage. Pt reports headache. Pt very anxious

## 2018-02-13 ENCOUNTER — Ambulatory Visit
Admission: EM | Admit: 2018-02-13 | Discharge: 2018-02-13 | Disposition: A | Discharge status: Other Acute Inpatient Hospital | Payer: BLUE CROSS/BLUE SHIELD

## 2018-03-03 ENCOUNTER — Other Ambulatory Visit: Payer: Self-pay | Admitting: Physician Assistant

## 2018-03-03 DIAGNOSIS — N644 Mastodynia: Secondary | ICD-10-CM

## 2018-03-14 ENCOUNTER — Ambulatory Visit
Admission: RE | Admit: 2018-03-14 | Discharge: 2018-03-14 | Disposition: A | Discharge status: Home / Self Care | Payer: BLUE CROSS/BLUE SHIELD | Source: Ambulatory Visit | Attending: Physician Assistant | Admitting: Physician Assistant

## 2018-03-14 DIAGNOSIS — N644 Mastodynia: Secondary | ICD-10-CM

## 2018-03-22 ENCOUNTER — Other Ambulatory Visit: Payer: Self-pay

## 2018-03-22 ENCOUNTER — Emergency Department: Payer: BLUE CROSS/BLUE SHIELD

## 2018-03-22 ENCOUNTER — Encounter: Payer: Self-pay | Admitting: Internal Medicine

## 2018-03-22 ENCOUNTER — Inpatient Hospital Stay
Admission: EM | Admit: 2018-03-22 | Discharge: 2018-03-27 | DRG: 392 | Disposition: A | Discharge status: Home / Self Care | Payer: BLUE CROSS/BLUE SHIELD | Attending: Internal Medicine | Admitting: Internal Medicine

## 2018-03-22 DIAGNOSIS — F419 Anxiety disorder, unspecified: Secondary | ICD-10-CM | POA: Diagnosis present

## 2018-03-22 DIAGNOSIS — K5792 Diverticulitis of intestine, part unspecified, without perforation or abscess without bleeding: Secondary | ICD-10-CM

## 2018-03-22 DIAGNOSIS — E876 Hypokalemia: Secondary | ICD-10-CM | POA: Diagnosis not present

## 2018-03-22 DIAGNOSIS — Z885 Allergy status to narcotic agent status: Secondary | ICD-10-CM

## 2018-03-22 DIAGNOSIS — T402X5A Adverse effect of other opioids, initial encounter: Secondary | ICD-10-CM | POA: Diagnosis present

## 2018-03-22 DIAGNOSIS — Z79899 Other long term (current) drug therapy: Secondary | ICD-10-CM

## 2018-03-22 DIAGNOSIS — J45909 Unspecified asthma, uncomplicated: Secondary | ICD-10-CM | POA: Diagnosis present

## 2018-03-22 DIAGNOSIS — L270 Generalized skin eruption due to drugs and medicaments taken internally: Secondary | ICD-10-CM | POA: Diagnosis present

## 2018-03-22 DIAGNOSIS — Z881 Allergy status to other antibiotic agents status: Secondary | ICD-10-CM

## 2018-03-22 DIAGNOSIS — F329 Major depressive disorder, single episode, unspecified: Secondary | ICD-10-CM | POA: Diagnosis present

## 2018-03-22 DIAGNOSIS — K5732 Diverticulitis of large intestine without perforation or abscess without bleeding: Principal | ICD-10-CM | POA: Diagnosis present

## 2018-03-22 DIAGNOSIS — Z91040 Latex allergy status: Secondary | ICD-10-CM

## 2018-03-22 DIAGNOSIS — T360X5A Adverse effect of penicillins, initial encounter: Secondary | ICD-10-CM | POA: Diagnosis present

## 2018-03-22 DIAGNOSIS — Z888 Allergy status to other drugs, medicaments and biological substances status: Secondary | ICD-10-CM

## 2018-03-22 HISTORY — DX: Unspecified asthma, uncomplicated: J45.909

## 2018-03-22 LAB — COMPREHENSIVE METABOLIC PANEL
ALBUMIN: 4.4 g/dL (ref 3.5–5.0)
ALK PHOS: 74 U/L (ref 38–126)
ALT: 27 U/L (ref 0–44)
AST: 24 U/L (ref 15–41)
Anion gap: 8 (ref 5–15)
BILIRUBIN TOTAL: 0.8 mg/dL (ref 0.3–1.2)
BUN: 19 mg/dL (ref 6–20)
CO2: 28 mmol/L (ref 22–32)
Calcium: 8.8 mg/dL — ABNORMAL LOW (ref 8.9–10.3)
Chloride: 101 mmol/L (ref 98–111)
Creatinine, Ser: 0.64 mg/dL (ref 0.44–1.00)
GFR calc Af Amer: >60 mL/min (ref >60)
GFR calc non Af Amer: >60 mL/min (ref >60)
GLUCOSE: 101 mg/dL — AB (ref 70–99)
Potassium: 3.5 mmol/L (ref 3.5–5.1)
Sodium: 137 mmol/L (ref 135–145)
TOTAL PROTEIN: 7.6 g/dL (ref 6.5–8.1)

## 2018-03-22 LAB — URINALYSIS, COMPLETE (UACMP) WITH MICROSCOPIC
BACTERIA UA: NONE SEEN
Bilirubin Urine: NEGATIVE
Glucose, UA: NEGATIVE mg/dL
KETONES UR: NEGATIVE mg/dL
LEUKOCYTES UA: NEGATIVE
Nitrite: NEGATIVE
PH: 6 (ref 5.0–8.0)
Protein, ur: NEGATIVE mg/dL
Specific Gravity, Urine: 1.01 (ref 1.005–1.030)

## 2018-03-22 LAB — LIPASE, BLOOD: Lipase: 43 U/L (ref 11–51)

## 2018-03-22 LAB — CBC
HEMATOCRIT: 41.3 % (ref 35.0–47.0)
HEMOGLOBIN: 14.6 g/dL (ref 12.0–16.0)
MCH: 30.6 pg (ref 26.0–34.0)
MCHC: 35.3 g/dL (ref 32.0–36.0)
MCV: 86.8 fL (ref 80.0–100.0)
Platelets: 314 10*3/uL (ref 150–440)
RBC: 4.76 MIL/uL (ref 3.80–5.20)
RDW: 13.6 % (ref 11.5–14.5)
WBC: 17.2 10*3/uL — AB (ref 3.6–11.0)

## 2018-03-22 MED ORDER — HYDROMORPHONE HCL 1 MG/ML IJ SOLN
0.5000 mg | Freq: Once | INTRAMUSCULAR | Status: AC
  Administered 2018-03-22: 0.5 mg via INTRAVENOUS
  Filled 2018-03-22: qty 1

## 2018-03-22 MED ORDER — KETOROLAC TROMETHAMINE 30 MG/ML IJ SOLN
30.0000 mg | Freq: Once | INTRAMUSCULAR | Status: AC
  Administered 2018-03-22: 30 mg via INTRAVENOUS
  Filled 2018-03-22: qty 1

## 2018-03-22 MED ORDER — METRONIDAZOLE IN NACL 5-0.79 MG/ML-% IV SOLN
500.0000 mg | Freq: Three times a day (TID) | INTRAVENOUS | Status: DC
  Administered 2018-03-22 – 2018-03-27 (×15): 500 mg via INTRAVENOUS
  Filled 2018-03-22 (×18): qty 100

## 2018-03-22 MED ORDER — BUPROPION HCL ER (SR) 150 MG PO TB12
150.0000 mg | ORAL_TABLET | Freq: Two times a day (BID) | ORAL | Status: DC
  Administered 2018-03-22 – 2018-03-27 (×8): 150 mg via ORAL
  Filled 2018-03-22 (×12): qty 1

## 2018-03-22 MED ORDER — HYDROCODONE-ACETAMINOPHEN 5-325 MG PO TABS
1.0000 | ORAL_TABLET | ORAL | Status: DC | PRN
  Administered 2018-03-22 – 2018-03-26 (×11): 1 via ORAL
  Filled 2018-03-22 (×12): qty 1

## 2018-03-22 MED ORDER — DIPHENHYDRAMINE HCL 50 MG/ML IJ SOLN
12.5000 mg | Freq: Once | INTRAMUSCULAR | Status: AC
  Administered 2018-03-22: 12.5 mg via INTRAVENOUS
  Filled 2018-03-22: qty 1

## 2018-03-22 MED ORDER — DOCUSATE SODIUM 100 MG PO CAPS
100.0000 mg | ORAL_CAPSULE | Freq: Two times a day (BID) | ORAL | Status: DC | PRN
  Administered 2018-03-24 – 2018-03-26 (×2): 100 mg via ORAL
  Filled 2018-03-22 (×3): qty 1

## 2018-03-22 MED ORDER — CIPROFLOXACIN IN D5W 400 MG/200ML IV SOLN
400.0000 mg | Freq: Two times a day (BID) | INTRAVENOUS | Status: DC
  Administered 2018-03-22 – 2018-03-27 (×10): 400 mg via INTRAVENOUS
  Filled 2018-03-22 (×13): qty 200

## 2018-03-22 MED ORDER — PIPERACILLIN-TAZOBACTAM 3.375 G IVPB 30 MIN
3.3750 g | Freq: Once | INTRAVENOUS | Status: AC
  Administered 2018-03-22: 3.375 g via INTRAVENOUS
  Filled 2018-03-22: qty 50

## 2018-03-22 MED ORDER — ONDANSETRON HCL 4 MG/2ML IJ SOLN
4.0000 mg | Freq: Four times a day (QID) | INTRAMUSCULAR | Status: DC | PRN
  Administered 2018-03-22 – 2018-03-23 (×3): 4 mg via INTRAVENOUS
  Filled 2018-03-22 (×3): qty 2

## 2018-03-22 MED ORDER — SODIUM CHLORIDE 0.9 % IV SOLN
INTRAVENOUS | Status: DC
  Administered 2018-03-22 – 2018-03-23 (×2): via INTRAVENOUS

## 2018-03-22 MED ORDER — ALBUTEROL SULFATE (2.5 MG/3ML) 0.083% IN NEBU: 2.5000 mg | INHALATION_SOLUTION | Freq: Four times a day (QID) | RESPIRATORY_TRACT | Status: DC | PRN

## 2018-03-22 MED ORDER — AMITRIPTYLINE HCL 10 MG PO TABS
10.0000 mg | ORAL_TABLET | Freq: Every day | ORAL | Status: DC
  Administered 2018-03-22 – 2018-03-26 (×4): 10 mg via ORAL
  Filled 2018-03-22 (×6): qty 1

## 2018-03-22 MED ORDER — KETOROLAC TROMETHAMINE 30 MG/ML IJ SOLN
30.0000 mg | Freq: Four times a day (QID) | INTRAMUSCULAR | Status: AC | PRN
  Administered 2018-03-22 – 2018-03-25 (×3): 30 mg via INTRAVENOUS
  Filled 2018-03-22 (×3): qty 1

## 2018-03-22 MED ORDER — ONDANSETRON HCL 4 MG/2ML IJ SOLN
4.0000 mg | Freq: Once | INTRAMUSCULAR | Status: AC
  Administered 2018-03-22: 4 mg via INTRAVENOUS
  Filled 2018-03-22: qty 2

## 2018-03-22 MED ORDER — HEPARIN SODIUM (PORCINE) 5000 UNIT/ML IJ SOLN
5000.0000 [IU] | Freq: Three times a day (TID) | INTRAMUSCULAR | Status: DC
  Administered 2018-03-22 – 2018-03-27 (×13): 5000 [IU] via SUBCUTANEOUS
  Filled 2018-03-22 (×12): qty 1

## 2018-03-22 NOTE — ED Notes (Signed)
Patient unable to provide urine sample at this time

## 2018-03-22 NOTE — H&P (Signed)
[facility] [place] NAME: Joann Brown    MR#:  [id number]  DATE OF BIRTH:  [birth year]  DATE OF ADMISSION:  03/22/2018  PRIMARY CARE PHYSICIAN: [facility] [place]   REQUESTING/REFERRING PHYSICIAN: Jimmye Norman  CHIEF COMPLAINT:   Chief Complaint  Patient presents with  . Abdominal Pain    HISTORY OF PRESENT ILLNESS: Joann Brown  is a 52 y.o. female with a known history of anxiety, asthma, hysterectomy due to cancer, depression-started having left-sided abdominal pain with nausea and excessive sweating to the point where she was needing to change her bedsheet in the morning and her clothes for last 2 to 3 days. In emergency room she is noted to have diverticulitis on CT scan abdomen without any localized collection of free air. Because of her significant pain and elevated white blood cell count and persistent nausea ER physician suggested to admit for IV antibiotics in hospital.  PAST MEDICAL HISTORY:   Past Medical History:  Diagnosis Date  . Anxiety   . Asthma   . 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 Depression     PAST SURGICAL HISTORY:  Past Surgical History:  Procedure Laterality Date  . ABDOMINAL HYSTERECTOMY     2/2 tumor in uterus   . APPENDECTOMY  1986  . brain aneurysm coiling    . BRAIN SURGERY  2014   coil placement for brain aneurysm   . TONSILLECTOMY     52 years old    SOCIAL HISTORY:  Social History   Tobacco Use  . Smoking status: Never Smoker  . Smokeless tobacco: Never Used  Substance Use Topics  . Alcohol use: Not Currently    FAMILY HISTORY:  Family History  Problem Relation Age of Onset  . Heart disease Father   . Heart disease Maternal Grandfather   . Heart disease Paternal Grandfather   . Breast cancer Sister     DRUG ALLERGIES:  Allergies  Allergen Reactions  . Latex   . Morphine And Related   . Percocet  [Oxycodone-Acetaminophen]   . Zosyn [Piperacillin Sod-Tazobactam So] Rash    Noted after IV inj in ER on 03/22/18    REVIEW OF SYSTEMS:   CONSTITUTIONAL: No fever, fatigue or weakness.  EYES: No blurred or double vision.  EARS, NOSE, AND THROAT: No tinnitus or ear pain.  RESPIRATORY: No cough, shortness of breath, wheezing or hemoptysis.  CARDIOVASCULAR: No chest pain, orthopnea, edema.  GASTROINTESTINAL: Have nausea, no vomiting, diarrhea, have abdominal pain.  GENITOURINARY: No dysuria, hematuria.  ENDOCRINE: No polyuria, nocturia,  HEMATOLOGY: No anemia, easy bruising or bleeding SKIN: No rash or lesion. MUSCULOSKELETAL: No joint pain or arthritis.   NEUROLOGIC: No tingling, numbness, weakness.  PSYCHIATRY: No anxiety or depression.   MEDICATIONS AT HOME:  Prior to Admission medications   Medication Sig Start Date End Date Taking? Authorizing Provider  albuterol (PROVENTIL HFA;VENTOLIN HFA) 108 (90 Base) MCG/ACT inhaler Inhale 2 puffs into the lungs every 6 (six) hours as needed for wheezing or shortness of breath.    Yes [provider]  amitriptyline (ELAVIL) 10 MG tablet Take 10 mg by mouth at bedtime.   Yes [provider]  buPROPion (ZYBAN) 150 MG 12 hr tablet Take 150 mg by mouth 2 (two) times daily.   Yes [provider]  Vitamin D, Ergocalciferol, (DRISDOL) 50000 units CAPS capsule Take 50,000 Units by mouth every Tuesday.  Yes [provider]  cyclobenzaprine (FLEXERIL) 5 MG tablet Take 1 tablet (5 mg total) by mouth 3 (three) times daily as needed for muscle spasms. Patient not taking: Reported on 03/22/2018 12/16/17   Vaughan Basta, MD  diazepam (VALIUM) 5 MG tablet Take 1 tablet (5 mg total) by mouth every 8 (eight) hours as needed for muscle spasms. Patient not taking: Reported on 03/22/2018 12/13/17   Earleen Newport, MD  HYDROcodone-acetaminophen (NORCO/VICODIN) 5-325 MG tablet Take 1 tablet by mouth 3 (three) times daily  as needed for moderate pain. Patient not taking: Reported on 03/22/2018 12/16/17 12/16/18  Vaughan Basta, MD  ondansetron (ZOFRAN-ODT) 4 MG disintegrating tablet Take 1 tablet (4 mg total) by mouth 3 (three) times daily before meals. Patient not taking: Reported on 03/22/2018 12/16/17   Vaughan Basta, MD  oxyCODONE (OXYCONTIN) 10 mg 12 hr tablet Take 1 tablet (10 mg total) by mouth every 12 (twelve) hours. Patient not taking: Reported on 03/22/2018 12/16/17   Vaughan Basta, MD      PHYSICAL EXAMINATION:   VITAL SIGNS: Blood pressure (!) 119/56, pulse 76, temperature 97.9 F (36.6 C), temperature source Oral, resp. rate 17, height 4\' 11"  (1.499 m), weight 65.8 kg, SpO2 99 %.  GENERAL:  52 y.o.-year-old patient lying in the bed with no acute distress.  EYES: Pupils equal, round, reactive to light and accommodation. No scleral icterus. Extraocular muscles intact.  HEENT: Head atraumatic, normocephalic. Oropharynx and nasopharynx clear.  NECK:  Supple, no jugular venous distention. No thyroid enlargement, no tenderness.  LUNGS: Normal breath sounds bilaterally, no wheezing, rales,rhonchi or crepitation. No use of accessory muscles of respiration.  CARDIOVASCULAR: S1, S2 normal. No murmurs, rubs, or gallops.  ABDOMEN: Soft, left-sided tender, nondistended. Bowel sounds present. No organomegaly or mass.  EXTREMITIES: No pedal edema, cyanosis, or clubbing.  NEUROLOGIC: Cranial nerves II through XII are intact. Muscle strength 5/5 in all extremities. Sensation intact. Gait not checked.  PSYCHIATRIC: The patient is alert and oriented x 3.  SKIN: No obvious rash, lesion, or ulcer.   LABORATORY PANEL:   CBC Recent Labs  Lab 03/22/18 0546  WBC 17.2*  HGB 14.6  HCT 41.3  PLT 314  MCV 86.8  MCH 30.6  MCHC 35.3  RDW 13.6   ------------------------------------------------------------------------------------------------------------------  Chemistries  Recent Labs  Lab  03/22/18 0546  NA 137  K 3.5  CL 101  CO2 28  GLUCOSE 101*  BUN 19  CREATININE 0.64  CALCIUM 8.8*  AST 24  ALT 27  ALKPHOS 74  BILITOT 0.8   ------------------------------------------------------------------------------------------------------------------ estimated creatinine clearance is 69.3 mL/min (by C-G formula based on SCr of 0.64 mg/dL). ------------------------------------------------------------------------------------------------------------------ No results for input(s): TSH, T4TOTAL, T3FREE, THYROIDAB in the last 72 hours.  Invalid input(s): FREET3   Coagulation profile No results for input(s): INR, PROTIME in the last 168 hours. ------------------------------------------------------------------------------------------------------------------- No results for input(s): DDIMER in the last 72 hours. -------------------------------------------------------------------------------------------------------------------  Cardiac Enzymes No results for input(s): CKMB, TROPONINI, MYOGLOBIN in the last 168 hours.  Invalid input(s): CK ------------------------------------------------------------------------------------------------------------------ Invalid input(s): POCBNP  ---------------------------------------------------------------------------------------------------------------  Urinalysis    Component Value Date/Time   COLORURINE STRAW (A) 03/22/2018 0755   APPEARANCEUR CLEAR (A) 03/22/2018 0755   LABSPEC 1.010 03/22/2018 0755   PHURINE 6.0 03/22/2018 0755   GLUCOSEU NEGATIVE 03/22/2018 0755   HGBUR SMALL (A) 03/22/2018 0755   BILIRUBINUR NEGATIVE 03/22/2018 0755   KETONESUR NEGATIVE 03/22/2018 0755   PROTEINUR NEGATIVE 03/22/2018 0755   NITRITE NEGATIVE 03/22/2018 0755   LEUKOCYTESUR NEGATIVE  03/22/2018 0755     RADIOLOGY: Ct Renal Stone Study  Result Date: 03/22/2018 CLINICAL DATA:  Lower abdominal pain for several days EXAM: CT ABDOMEN AND PELVIS  WITHOUT CONTRAST TECHNIQUE: Multidetector CT imaging of the abdomen and pelvis was performed following the standard protocol without oral or IV contrast. COMPARISON:  None. FINDINGS: Lower chest: There is mild bibasilar atelectasis. There is a 5 mm nodular opacity in the inferior lingula on axial slice 5 series 4. Hepatobiliary: No focal liver lesions are evident on this noncontrast enhanced study. Gallbladder wall is not appreciably thickened. There is no biliary duct dilatation. Pancreas: No pancreatic mass or inflammatory focus. Spleen: No splenic lesions are apparent. Adrenals/Urinary Tract: Adrenals bilaterally appear normal. Kidneys bilaterally show no evident mass or hydronephrosis on either side. There is no renal or ureteral calculus on either side. Urinary bladder is midline. There is mild thickening of the wall of the urinary bladder, more severe on the left side than right side. Stomach/Bowel: There is wall thickening throughout much of the sigmoid colon. There are irregular diverticula with adjacent mesenteric thickening in the proximal to mid sigmoid colon region consistent with diverticulitis. There is no abscess in this area of diverticulitis. No perforation is appreciable. There are diverticula elsewhere in the descending colon and sigmoid colon without associated diverticulitis. There is no appreciable bowel wall thickening apart from the sigmoid colon. There is no evident bowel obstruction. No free air or portal venous air is appreciated. Vascular/Lymphatic: There is no abdominal aortic aneurysm. No vascular lesions are evident on this noncontrast enhanced study. There is no appreciable adenopathy in the abdomen or pelvis. Reproductive: Uterus is absent.  No pelvic mass evident. Other: Appendix absent. No periappendiceal region inflammation. No abscess or ascites is evident in the abdomen or pelvis. There is a small ventral hernia containing only fat. Musculoskeletal: There is degenerative change  in the lumbar spine. There are no blastic or lytic bone lesions. There is lumbar dextroscoliosis. No intramuscular or abdominal wall lesions are appreciable. IMPRESSION: 1. Diverticulitis involving the proximal to mid sigmoid colon with wall thickening and mesenteric stranding/thickening. No perforation or abscess appreciable in this area. 2. No bowel obstruction. No free air or portal venous air. No abscess elsewhere in the abdomen or pelvis. Appendix absent. 3. Urinary bladder wall thickening which may be indicative of cystitis. The wall thickening is somewhat more severe on the left than on the right which could in part be due to inflammation from the nearby diverticulitis. 4.  No appreciable renal or ureteral calculus.  No hydronephrosis. 5.  Small ventral hernia containing only fat. 6. 5 mm nodular opacity inferior lingula. No follow-up needed if patient is low-risk. Non-contrast chest CT can be considered in 12 months if patient is high-risk. This recommendation follows the consensus statement: Guidelines for Management of Incidental Pulmonary Nodules Detected on CT Images: From the Fleischner Society 2017; Radiology 2017; 284:228-243. Electronically Signed   By: Lowella Grip III M.D.   On: 03/22/2018 07:32    EKG: No orders found for this or any previous visit.  IMPRESSION AND PLAN:  *Acute diverticulitis While patient was given Zosyn in the ER either diet or injection Dilaudid, she had some rashes. I will change and give Cipro and Flagyl IV. Zofran as needed. IV fluids and liquid diet.  *Depression Continue amitriptyline, bupropion.  *Rashes Possible allergic reaction to Zosyn or Dilaudid IV Benadryl 1 dose and monitor.  All the records are reviewed and case discussed with ED provider. Management  plans discussed with the patient, family and they are in agreement.  CODE STATUS: Full code    Code Status Orders  (From admission, onward)         Start     Ordered   03/22/18  1432  Full code  Continuous     03/22/18 1432        Code Status History    Date Active Date Inactive Code Status Order ID Comments User Context   12/15/2017 0201 12/17/2017 1412 Full Code [id number]  Lance Coon, MD Inpatient       TOTAL TIME TAKING CARE OF THIS PATIENT: 50 minutes.  Patient's daughter was present in the room during my visit.  Vaughan Basta M.D on 03/22/2018   Between 7am to 6pm - Pager - [phone]  After 6pm go to [email or web address] - password [email or web address] [facility]  Office  [phone]  CC: Primary care physician; [facility] [place]   Note: This dictation was prepared with Diplomatic Services operational officer dictation along with smaller phrase technology. Any transcriptional errors that result from this process are unintentional.

## 2018-03-22 NOTE — ED Notes (Signed)
Pt transported to 154

## 2018-03-22 NOTE — Progress Notes (Signed)
Pt arrived to room 154 from ED. Pain medication given for left abdominal pain. Zofran given for nausea. IV infusing NS. Pt on room air. Daughter at bedside. Pt independent and ambulatory walking around nursing station. Phone and call bell within reach. No other complaints from pt at this time.

## 2018-03-22 NOTE — H&P (Signed)
Came with left side abd pain, nausea, excessive sweating for last 2-3 days.  Noted to have diverticulitis on CT abd  Admit for IV Abx. And IV fluids. Liquid diet due to nausea.

## 2018-03-22 NOTE — ED Triage Notes (Signed)
Pt in with co generalized abd pain that started 3 days ago and now worse to llq, states does have a hx of diverticulosis.

## 2018-03-22 NOTE — ED Provider Notes (Signed)
[facility] Emergency Department Provider Note       Time seen: ----------------------------------------- 7:21 AM on 03/22/2018 -----------------------------------------   I have reviewed the triage vital signs and the nursing notes.  HISTORY   Chief Complaint Abdominal Pain    HPI Joann Brown is a 52 y.o. female with a history of anxiety, cancer, depression who presents to the ED for her loss abdominal pain that started 3 days ago and is now in the left lower quadrant.  Patient states the pain radiates into her back on the left side.  She also has some pain in her leg.  She does report a history of diverticulosis.  Nothing has made the pain better.  She denies fevers or chills.  Past Medical History:  Diagnosis Date  . Anxiety   . 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 Depression     Patient Active Problem List   Diagnosis Date Noted  . Black widow spider bite 12/15/2017  . Depression with anxiety 12/15/2017    Past Surgical History:  Procedure Laterality Date  . ABDOMINAL HYSTERECTOMY     2/2 tumor in uterus   . APPENDECTOMY  1986  . BRAIN SURGERY  2014   coil placement for brain aneurysm   . TONSILLECTOMY     52 years old    Allergies Latex; Morphine and related; and Percocet [oxycodone-acetaminophen]  Social History Social History   Tobacco Use  . Smoking status: Never Smoker  . Smokeless tobacco: Never Used  Substance Use Topics  . Alcohol use: Not Currently  . Drug use: Not Currently   Review of Systems Constitutional: Negative for fever. Cardiovascular: Negative for chest pain. Respiratory: Negative for shortness of breath. Gastrointestinal: Positive for flank pain Musculoskeletal: Negative for back pain. Skin: Negative for rash. Neurological: Negative for headaches, focal weakness or numbness.  All systems negative/normal/unremarkable except as stated in the  HPI  ____________________________________________   PHYSICAL EXAM:  VITAL SIGNS: ED Triage Vitals  Enc Vitals Group     BP 03/22/18 0545 (!) 135/56     Pulse Rate 03/22/18 0545 89     Resp 03/22/18 0545 18     Temp 03/22/18 0545 97.8 F (36.6 C)     Temp Source 03/22/18 0545 Oral     SpO2 03/22/18 0545 100 %     Weight 03/22/18 0544 145 lb (65.8 kg)     Height 03/22/18 0544 4\' 11"  (1.499 m)     Head Circumference --      Peak Flow --      Pain Score 03/22/18 0544 10     Pain Loc --      Pain Edu? --      Excl. in [place]? --    Constitutional: Alert and oriented.  Moderate distress Eyes: Conjunctivae are normal. Normal extraocular movements. ENT   Head: Normocephalic and atraumatic.   Nose: No congestion/rhinnorhea.   Mouth/Throat: Mucous membranes are moist.   Neck: No stridor. Cardiovascular: Normal rate, regular rhythm. No murmurs, rubs, or gallops. Respiratory: Normal respiratory effort without tachypnea nor retractions. Breath sounds are clear and equal bilaterally. No wheezes/rales/rhonchi. Gastrointestinal: Left flank tenderness, no rebound or guarding.  Normal bowel sounds. Musculoskeletal: Nontender with normal range of motion in extremities. No lower extremity tenderness nor edema. Neurologic:  Normal speech and language. No gross focal neurologic deficits are appreciated.  Skin:  Skin is warm, dry and intact. No rash noted. ____________________________________________  ED COURSE:  As part of my medical decision making, I reviewed the following data within the [place] History obtained from family if available, nursing notes, old chart and ekg, as well as notes from prior ED visits. Patient presented for left flank pain, we will assess with labs and imaging as indicated at this time.   Procedures ____________________________________________   LABS (pertinent positives/negatives)  Labs Reviewed  CBC - Abnormal; Notable for the  following components:      Result Value   WBC 17.2 (*)    All other components within normal limits  COMPREHENSIVE METABOLIC PANEL - Abnormal; Notable for the following components:   Glucose, Bld 101 (*)    Calcium 8.8 (*)    All other components within normal limits  URINALYSIS, COMPLETE (UACMP) WITH MICROSCOPIC - Abnormal; Notable for the following components:   Color, Urine STRAW (*)    APPearance CLEAR (*)    Hgb urine dipstick SMALL (*)    All other components within normal limits  LIPASE, BLOOD    RADIOLOGY Images were viewed by me  CT renal protocol IMPRESSION: 1. Diverticulitis involving the proximal to mid sigmoid colon with wall thickening and mesenteric stranding/thickening. No perforation or abscess appreciable in this area.  2. No bowel obstruction. No free air or portal venous air. No abscess elsewhere in the abdomen or pelvis. Appendix absent.  3. Urinary bladder wall thickening which may be indicative of cystitis. The wall thickening is somewhat more severe on the left than on the right which could in part be due to inflammation from the nearby diverticulitis.  4.  No appreciable renal or ureteral calculus.  No hydronephrosis.  5.  Small ventral hernia containing only fat.  6. 5 mm nodular opacity inferior lingula. No follow-up needed if patient is low-risk. Non-contrast chest CT can be considered in 12 months if patient is high-risk. This recommendation follows the consensus statement: Guidelines for Management of Incidental Pulmonary Nodules Detected on CT Images: From the Fleischner Society 2017; Radiology 2017; 284:228-243. ____________________________________________  DIFFERENTIAL DIAGNOSIS   Renal colic, UTI, pyelonephritis, diverticulitis, radicular back pain  FINAL ASSESSMENT AND PLAN  Diverticulitis   Plan: The patient had presented for left flank pain. Patient's labs did reveal marked leukocytosis. Patient's imaging did reveal  significant diverticulitis and this coincides with her elevated white blood cell count.  She has received IV fluids as well as Dilaudid and I have ordered IV Zosyn for her.  She is in significant pain and I think she warrants at least hospital observation.  I will discuss with the hospitalist for admission.   Laurence Aly, MD   Note: This note was generated in part or whole with voice recognition software. Voice recognition is usually quite accurate but there are transcription errors that can and very often do occur. I apologize for any typographical errors that were not detected and corrected.     Earleen Newport, MD 03/22/18 0830

## 2018-03-23 LAB — BASIC METABOLIC PANEL
ANION GAP: 8 (ref 5–15)
BUN: 8 mg/dL (ref 6–20)
CHLORIDE: 103 mmol/L (ref 98–111)
CO2: 27 mmol/L (ref 22–32)
Calcium: 8.4 mg/dL — ABNORMAL LOW (ref 8.9–10.3)
Creatinine, Ser: 0.54 mg/dL (ref 0.44–1.00)
GFR calc non Af Amer: >60 mL/min (ref >60)
GLUCOSE: 114 mg/dL — AB (ref 70–99)
Potassium: 3.3 mmol/L — ABNORMAL LOW (ref 3.5–5.1)
Sodium: 138 mmol/L (ref 135–145)

## 2018-03-23 LAB — CBC
HCT: 35.4 % (ref 35.0–47.0)
Hemoglobin: 12.6 g/dL (ref 12.0–16.0)
MCH: 31.3 pg (ref 26.0–34.0)
MCHC: 35.5 g/dL (ref 32.0–36.0)
MCV: 88.2 fL (ref 80.0–100.0)
Platelets: 260 10*3/uL (ref 150–440)
RBC: 4.01 MIL/uL (ref 3.80–5.20)
RDW: 13.6 % (ref 11.5–14.5)
WBC: 11.7 10*3/uL — ABNORMAL HIGH (ref 3.6–11.0)

## 2018-03-23 MED ORDER — PANTOPRAZOLE SODIUM 40 MG PO TBEC
40.0000 mg | DELAYED_RELEASE_TABLET | Freq: Two times a day (BID) | ORAL | Status: DC
  Administered 2018-03-23 – 2018-03-27 (×6): 40 mg via ORAL
  Filled 2018-03-23 (×8): qty 1

## 2018-03-23 MED ORDER — BISACODYL 10 MG RE SUPP
10.0000 mg | Freq: Every day | RECTAL | Status: DC | PRN
  Administered 2018-03-23: 10 mg via RECTAL
  Filled 2018-03-23: qty 1

## 2018-03-23 MED ORDER — FAMOTIDINE IN NACL 20-0.9 MG/50ML-% IV SOLN
20.0000 mg | Freq: Two times a day (BID) | INTRAVENOUS | Status: DC
  Administered 2018-03-23 – 2018-03-26 (×7): 20 mg via INTRAVENOUS
  Filled 2018-03-23 (×7): qty 50

## 2018-03-23 MED ORDER — POTASSIUM CHLORIDE CRYS ER 20 MEQ PO TBCR
20.0000 meq | EXTENDED_RELEASE_TABLET | Freq: Two times a day (BID) | ORAL | Status: DC
  Administered 2018-03-23 – 2018-03-27 (×7): 20 meq via ORAL
  Filled 2018-03-23 (×9): qty 1

## 2018-03-23 NOTE — Progress Notes (Signed)
[facility] [place] NAME: Joann Brown    MR#:  [id number]  DATE OF BIRTH:  December 19, 1965  SUBJECTIVE:  CHIEF COMPLAINT:   Chief Complaint  Patient presents with  . Abdominal Pain    Still have Abd pain and have no BM yet. Nausea after food.  REVIEW OF SYSTEMS:  CONSTITUTIONAL: No fever, fatigue or weakness.  EYES: No blurred or double vision.  EARS, NOSE, AND THROAT: No tinnitus or ear pain.  RESPIRATORY: No cough, shortness of breath, wheezing or hemoptysis.  CARDIOVASCULAR: No chest pain, orthopnea, edema.  GASTROINTESTINAL: have nausea,no vomiting, diarrhea or abdominal pain.  GENITOURINARY: No dysuria, hematuria.  ENDOCRINE: No polyuria, nocturia,  HEMATOLOGY: No anemia, easy bruising or bleeding SKIN: No rash or lesion. MUSCULOSKELETAL: No joint pain or arthritis.   NEUROLOGIC: No tingling, numbness, weakness.  PSYCHIATRY: No anxiety or depression.   ROS  DRUG ALLERGIES:   Allergies  Allergen Reactions  . Latex   . Morphine And Related   . Percocet [Oxycodone-Acetaminophen]   . Zosyn [Piperacillin Sod-Tazobactam So] Rash    Noted after IV inj in ER on 03/22/18    VITALS:  Blood pressure 105/62, pulse 70, temperature 97.6 F (36.4 C), temperature source Oral, resp. rate 19, height 4\' 11"  (1.499 m), weight 65.8 kg, SpO2 100 %.  PHYSICAL EXAMINATION:  GENERAL:  52 y.o.-year-old patient lying in the bed with no acute distress.  EYES: Pupils equal, round, reactive to light and accommodation. No scleral icterus. Extraocular muscles intact.  HEENT: Head atraumatic, normocephalic. Oropharynx and nasopharynx clear.  NECK:  Supple, no jugular venous distention. No thyroid enlargement, no tenderness.  LUNGS: Normal breath sounds bilaterally, no wheezing, rales,rhonchi or crepitation. No use of accessory muscles of respiration.  CARDIOVASCULAR: S1, S2 normal. No murmurs, rubs, or gallops.  ABDOMEN: Soft, left side tender,  nondistended. Bowel sounds sluggish . No organomegaly or mass.  EXTREMITIES: No pedal edema, cyanosis, or clubbing.  NEUROLOGIC: Cranial nerves II through XII are intact. Muscle strength 5/5 in all extremities. Sensation intact. Gait not checked.  PSYCHIATRIC: The patient is alert and oriented x 3.  SKIN: No obvious rash, lesion, or ulcer.   Physical Exam LABORATORY PANEL:   CBC Recent Labs  Lab 03/23/18 0446  WBC 11.7*  HGB 12.6  HCT 35.4  PLT 260   ------------------------------------------------------------------------------------------------------------------  Chemistries  Recent Labs  Lab 03/22/18 0546 03/23/18 0446  NA 137 138  K 3.5 3.3*  CL 101 103  CO2 28 27  GLUCOSE 101* 114*  BUN 19 8  CREATININE 0.64 0.54  CALCIUM 8.8* 8.4*  AST 24  --   ALT 27  --   ALKPHOS 74  --   BILITOT 0.8  --    ------------------------------------------------------------------------------------------------------------------  Cardiac Enzymes No results for input(s): TROPONINI in the last 168 hours. ------------------------------------------------------------------------------------------------------------------  RADIOLOGY:  Ct Renal Stone Study  Result Date: 03/22/2018 CLINICAL DATA:  Lower abdominal pain for several days EXAM: CT ABDOMEN AND PELVIS WITHOUT CONTRAST TECHNIQUE: Multidetector CT imaging of the abdomen and pelvis was performed following the standard protocol without oral or IV contrast. COMPARISON:  None. FINDINGS: Lower chest: There is mild bibasilar atelectasis. There is a 5 mm nodular opacity in the inferior lingula on axial slice 5 series 4. Hepatobiliary: No focal liver lesions are evident on this noncontrast enhanced study. Gallbladder wall is not appreciably thickened. There is no biliary duct dilatation. Pancreas: No pancreatic mass or inflammatory focus. Spleen: No splenic  lesions are apparent. Adrenals/Urinary Tract: Adrenals bilaterally appear normal. Kidneys  bilaterally show no evident mass or hydronephrosis on either side. There is no renal or ureteral calculus on either side. Urinary bladder is midline. There is mild thickening of the wall of the urinary bladder, more severe on the left side than right side. Stomach/Bowel: There is wall thickening throughout much of the sigmoid colon. There are irregular diverticula with adjacent mesenteric thickening in the proximal to mid sigmoid colon region consistent with diverticulitis. There is no abscess in this area of diverticulitis. No perforation is appreciable. There are diverticula elsewhere in the descending colon and sigmoid colon without associated diverticulitis. There is no appreciable bowel wall thickening apart from the sigmoid colon. There is no evident bowel obstruction. No free air or portal venous air is appreciated. Vascular/Lymphatic: There is no abdominal aortic aneurysm. No vascular lesions are evident on this noncontrast enhanced study. There is no appreciable adenopathy in the abdomen or pelvis. Reproductive: Uterus is absent.  No pelvic mass evident. Other: Appendix absent. No periappendiceal region inflammation. No abscess or ascites is evident in the abdomen or pelvis. There is a small ventral hernia containing only fat. Musculoskeletal: There is degenerative change in the lumbar spine. There are no blastic or lytic bone lesions. There is lumbar dextroscoliosis. No intramuscular or abdominal wall lesions are appreciable. IMPRESSION: 1. Diverticulitis involving the proximal to mid sigmoid colon with wall thickening and mesenteric stranding/thickening. No perforation or abscess appreciable in this area. 2. No bowel obstruction. No free air or portal venous air. No abscess elsewhere in the abdomen or pelvis. Appendix absent. 3. Urinary bladder wall thickening which may be indicative of cystitis. The wall thickening is somewhat more severe on the left than on the right which could in part be due to  inflammation from the nearby diverticulitis. 4.  No appreciable renal or ureteral calculus.  No hydronephrosis. 5.  Small ventral hernia containing only fat. 6. 5 mm nodular opacity inferior lingula. No follow-up needed if patient is low-risk. Non-contrast chest CT can be considered in 12 months if patient is high-risk. This recommendation follows the consensus statement: Guidelines for Management of Incidental Pulmonary Nodules Detected on CT Images: From the Fleischner Society 2017; Radiology 2017; 284:228-243. Electronically Signed   By: Lowella Grip III M.D.   On: 03/22/2018 07:32    ASSESSMENT AND PLAN:   Principal Problem:   Diverticulitis  *Acute diverticulitis While patient was given Zosyn in the ER either diet or injection Dilaudid, she had some rashes.  give Cipro and Flagyl IV. Zofran as needed. IV fluids and liquid diet. Could not do well with soft food, switch again to full liquid.  *Depression Continue amitriptyline, bupropion.  *Rashes Possible allergic reaction to Zosyn or Dilaudid IV Benadryl 1 dose and monitor.    All the records are reviewed and case discussed with Care Management/Social Workerr. Management plans discussed with the patient, family and they are in agreement.  CODE STATUS: Full.  TOTAL TIME TAKING CARE OF THIS PATIENT:35  minutes.     POSSIBLE D/C IN *1-2 DAYS, DEPENDING ON CLINICAL CONDITION.   Vaughan Basta M.D on 03/23/2018   Between 7am to 6pm - Pager - [phone]  After 6pm go to [email or web address] - password [email or web address] [facility]  Office  [phone]  CC: Primary care physician; Center, [place]  Note: This dictation was prepared with Diplomatic Services operational officer dictation along with smaller phrase technology. Any transcriptional errors that result from this process  are unintentional.

## 2018-03-24 DIAGNOSIS — K5792 Diverticulitis of intestine, part unspecified, without perforation or abscess without bleeding: Secondary | ICD-10-CM

## 2018-03-24 MED ORDER — ONDANSETRON 4 MG PO TBDP
4.0000 mg | ORAL_TABLET | Freq: Three times a day (TID) | ORAL | Status: DC
  Administered 2018-03-24 – 2018-03-27 (×6): 4 mg via ORAL
  Filled 2018-03-24 (×11): qty 1

## 2018-03-24 MED ORDER — SODIUM CHLORIDE 0.9 % IV SOLN
INTRAVENOUS | Status: DC
  Administered 2018-03-24 – 2018-03-26 (×4): via INTRAVENOUS

## 2018-03-24 MED ORDER — PROMETHAZINE HCL 25 MG/ML IJ SOLN
25.0000 mg | Freq: Four times a day (QID) | INTRAMUSCULAR | Status: DC | PRN
  Administered 2018-03-25: 25 mg via INTRAVENOUS
  Filled 2018-03-24: qty 1

## 2018-03-24 NOTE — Progress Notes (Signed)
[facility] [place] NAME: Joann Brown    MR#:  [id number]  DATE OF BIRTH:  Aug 05, 1965  SUBJECTIVE:  CHIEF COMPLAINT:   Chief Complaint  Patient presents with  . Abdominal Pain    Still have Abd pain and have no BM yet. Nausea after food.  Said pain is slight better than admission.  REVIEW OF SYSTEMS:  CONSTITUTIONAL: No fever, fatigue or weakness.  EYES: No blurred or double vision.  EARS, NOSE, AND THROAT: No tinnitus or ear pain.  RESPIRATORY: No cough, shortness of breath, wheezing or hemoptysis.  CARDIOVASCULAR: No chest pain, orthopnea, edema.  GASTROINTESTINAL: have nausea,no vomiting, diarrhea or abdominal pain.  GENITOURINARY: No dysuria, hematuria.  ENDOCRINE: No polyuria, nocturia,  HEMATOLOGY: No anemia, easy bruising or bleeding SKIN: No rash or lesion. MUSCULOSKELETAL: No joint pain or arthritis.   NEUROLOGIC: No tingling, numbness, weakness.  PSYCHIATRY: No anxiety or depression.   ROS  DRUG ALLERGIES:   Allergies  Allergen Reactions  . Latex   . Morphine And Related   . Percocet [Oxycodone-Acetaminophen]   . Zosyn [Piperacillin Sod-Tazobactam So] Rash    Noted after IV inj in ER on 03/22/18    VITALS:  Blood pressure 119/65, pulse 66, temperature 97.6 F (36.4 C), temperature source Oral, resp. rate 18, height 4\' 11"  (1.499 m), weight 65.8 kg, SpO2 100 %.  PHYSICAL EXAMINATION:  GENERAL:  52 y.o.-year-old patient lying in the bed with no acute distress.  EYES: Pupils equal, round, reactive to light and accommodation. No scleral icterus. Extraocular muscles intact.  HEENT: Head atraumatic, normocephalic. Oropharynx and nasopharynx clear.  NECK:  Supple, no jugular venous distention. No thyroid enlargement, no tenderness.  LUNGS: Normal breath sounds bilaterally, no wheezing, rales,rhonchi or crepitation. No use of accessory muscles of respiration.  CARDIOVASCULAR: S1, S2 normal. No murmurs, rubs,  or gallops.  ABDOMEN: Soft, left side tender, nondistended. Bowel sounds sluggish . No organomegaly or mass.  EXTREMITIES: No pedal edema, cyanosis, or clubbing.  NEUROLOGIC: Cranial nerves II through XII are intact. Muscle strength 5/5 in all extremities. Sensation intact. Gait not checked.  PSYCHIATRIC: The patient is alert and oriented x 3.  SKIN: No obvious rash, lesion, or ulcer.   Physical Exam LABORATORY PANEL:   CBC Recent Labs  Lab 03/23/18 0446  WBC 11.7*  HGB 12.6  HCT 35.4  PLT 260   ------------------------------------------------------------------------------------------------------------------  Chemistries  Recent Labs  Lab 03/22/18 0546 03/23/18 0446  NA 137 138  K 3.5 3.3*  CL 101 103  CO2 28 27  GLUCOSE 101* 114*  BUN 19 8  CREATININE 0.64 0.54  CALCIUM 8.8* 8.4*  AST 24  --   ALT 27  --   ALKPHOS 74  --   BILITOT 0.8  --    ------------------------------------------------------------------------------------------------------------------  Cardiac Enzymes No results for input(s): TROPONINI in the last 168 hours. ------------------------------------------------------------------------------------------------------------------  RADIOLOGY:  No results found.  ASSESSMENT AND PLAN:   Principal Problem:   Diverticulitis  *Acute diverticulitis While patient was given Zosyn in the ER either diet or injection Dilaudid, she had some rashes.  give Cipro and Flagyl IV. Zofran as needed. IV fluids and liquid diet. Could not do well with soft food, switch again to full liquid.  As complains of persistent symptoms, called GI to help determine any other diagnosis.  *Depression Continue amitriptyline, bupropion.  *Rashes Possible allergic reaction to Zosyn or Dilaudid IV Benadryl 1 dose and monitor.  * Hypokalemia- replace.  All the records are reviewed and case discussed with Care Management/Social Workerr. Management plans discussed with the  patient, family and they are in agreement.  CODE STATUS: Full.  TOTAL TIME TAKING CARE OF THIS PATIENT:35  minutes.     POSSIBLE D/C IN *1-2 DAYS, DEPENDING ON CLINICAL CONDITION.   Vaughan Basta M.D on 03/24/2018   Between 7am to 6pm - Pager - [phone]  After 6pm go to [email or web address] - password [email or web address] [facility]  Office  [phone]  CC: Primary care physician; Center, [place]  Note: This dictation was prepared with Diplomatic Services operational officer dictation along with smaller phrase technology. Any transcriptional errors that result from this process are unintentional.

## 2018-03-24 NOTE — Consult Note (Signed)
Vonda Antigua, MD [address] [place], [place], [place], [address] [address], [place], [place], [place], [address] Phone: [phone]  Fax: [phone]  Consultation  Referring Provider:     Dr. Anselm Jungling Primary Care Physician:  [facility], [place] Reason for Consultation:     Abdominal Pain  Date of Admission:  03/22/2018 Date of Consultation:  03/24/2018         HPI:   Joann Brown is a 52 y.o. female admitted with diverticulitis. Pain started 2 days ago, sharp, LLQ, non radiating, no previous history of similar symptoms.  No melena or hematochezia.  No diarrhea.  No fever chills.  Also reports nausea and vomiting.  No emesis today.  No hematemesis.  No weight loss.  Last colonoscopy, June 2017, available in care everywhere, normal terminal ileum, 2 mm hepatic flexure polyp removed, 2 rectal polyps removed, multiple diverticula reported.  Pathology report not available.  Repeat recommended in 5 years.  Past Medical History:  Diagnosis Date  . Anxiety   . Asthma   . Cancer [facility]) 2007    hysterectomy to remove tumor in uterus   . Depression     Past Surgical History:  Procedure Laterality Date  . ABDOMINAL HYSTERECTOMY     2/2 tumor in uterus   . APPENDECTOMY  1986  . brain aneurysm coiling    . BRAIN SURGERY  2014   coil placement for brain aneurysm   . TONSILLECTOMY     52 years old    Prior to Admission medications   Medication Sig Start Date End Date Taking? Authorizing Provider  albuterol (PROVENTIL HFA;VENTOLIN HFA) 108 (90 Base) MCG/ACT inhaler Inhale 2 puffs into the lungs every 6 (six) hours as needed for wheezing or shortness of breath.    Yes [provider]  amitriptyline (ELAVIL) 10 MG tablet Take 10 mg by mouth at bedtime.   Yes [provider]  buPROPion (ZYBAN) 150 MG 12 hr tablet Take 150 mg by mouth 2 (two) times daily.   Yes [provider]  Vitamin D, Ergocalciferol, (DRISDOL) 50000  units CAPS capsule Take 50,000 Units by mouth every Tuesday.   Yes [provider]  cyclobenzaprine (FLEXERIL) 5 MG tablet Take 1 tablet (5 mg total) by mouth 3 (three) times daily as needed for muscle spasms. Patient not taking: Reported on 03/22/2018 12/16/17   Vaughan Basta, MD  diazepam (VALIUM) 5 MG tablet Take 1 tablet (5 mg total) by mouth every 8 (eight) hours as needed for muscle spasms. Patient not taking: Reported on 03/22/2018 12/13/17   Earleen Newport, MD  HYDROcodone-acetaminophen (NORCO/VICODIN) 5-325 MG tablet Take 1 tablet by mouth 3 (three) times daily as needed for moderate pain. Patient not taking: Reported on 03/22/2018 12/16/17 12/16/18  Vaughan Basta, MD  ondansetron (ZOFRAN-ODT) 4 MG disintegrating tablet Take 1 tablet (4 mg total) by mouth 3 (three) times daily before meals. Patient not taking: Reported on 03/22/2018 12/16/17   Vaughan Basta, MD  oxyCODONE (OXYCONTIN) 10 mg 12 hr tablet Take 1 tablet (10 mg total) by mouth every 12 (twelve) hours. Patient not taking: Reported on 03/22/2018 12/16/17   Vaughan Basta, MD    Family History  Problem Relation Age of Onset  . Heart disease Father   . Heart disease Maternal Grandfather   . Heart disease Paternal Grandfather   . Breast cancer Sister      Social History   Tobacco Use  . Smoking status: Never  Smoker  . Smokeless tobacco: Never Used  Substance Use Topics  . Alcohol use: Not Currently  . Drug use: Not Currently    Allergies as of 03/22/2018 - Review Complete 03/22/2018  Allergen Reaction Noted  . Latex  12/01/2016  . Morphine and related  12/01/2016  . Percocet [oxycodone-acetaminophen]  12/01/2016  . Zosyn [piperacillin sod-tazobactam so] Rash 03/22/2018    Review of Systems:    All systems reviewed and negative except where noted in HPI.   Physical Exam:  Vital signs in last 24 hours: Vitals:   03/23/18 1600 03/23/18 1938 03/24/18 0346 03/24/18 0750    BP: 132/86 124/77 99/61 110/63  Pulse: 92 94 82 80  Resp:  18 18 18   Temp: 98.3 F (36.8 C) 98.2 F (36.8 C) 98 F (36.7 C) 98.6 F (37 C)  TempSrc: Oral Oral Oral Oral  SpO2: 100% 100% 95% 97%  Weight:      Height:         General:   Pleasant, cooperative in NAD Head:  Normocephalic and atraumatic. Eyes:   No icterus.   Conjunctiva pink. PERRLA. Ears:  Normal auditory acuity. Neck:  Supple; no masses or thyroidomegaly Lungs: Respirations even and unlabored. Lungs clear to auscultation bilaterally.   No wheezes, crackles, or rhonchi.  Abdomen:  Soft, nondistended, tender to palpation left lower quadrant. Normal bowel sounds. No appreciable masses or hepatomegaly.  No rebound or guarding.  Neurologic:  Alert and oriented x3;  grossly normal neurologically. Skin:  Intact without significant lesions or rashes. Cervical Nodes:  No significant cervical adenopathy. Psych:  Alert and cooperative. Normal affect.  LAB RESULTS: Recent Labs    03/22/18 0546 03/23/18 0446  WBC 17.2* 11.7*  HGB 14.6 12.6  HCT 41.3 35.4  PLT 314 260   BMET Recent Labs    03/22/18 0546 03/23/18 0446  NA 137 138  K 3.5 3.3*  CL 101 103  CO2 28 27  GLUCOSE 101* 114*  BUN 19 8  CREATININE 0.64 0.54  CALCIUM 8.8* 8.4*   LFT Recent Labs    03/22/18 0546  PROT 7.6  ALBUMIN 4.4  AST 24  ALT 27  ALKPHOS 74  BILITOT 0.8   PT/INR No results for input(s): LABPROT, INR in the last 72 hours.  STUDIES: CT report reviewed   Impression / Plan:   Joann Brown is a 52 y.o. y/o female with diverticulitis  Patient's abdominal pain, location, and CT findings all consistent with diverticulitis No clinical signs or imaging evidence of perforation She is on appropriate gram-negative coverage for diverticulitis  Okay to continue diet as tolerated Continue serial abdominal exams If clinical condition worsens, especially, if acute abdomen is noted, would recommend immediate surgical  evaluation at that time  Recent colonoscopy in 2017 is reassuring, therefore unlikely to have new colonic malignancy given recent colonoscopy.  CT also reported urinary bladder wall thickening or cystitis.  Would recommend urology consult    Thank you for involving me in the care of this patient.      LOS: 2 days   Virgel Manifold, MD  03/24/2018, 3:39 PM

## 2018-03-24 NOTE — Plan of Care (Signed)
  Problem: Activity: Goal: Risk for activity intolerance will decrease Outcome: Progressing   Problem: Nutrition: Goal: Adequate nutrition will be maintained Outcome: Progressing   Problem: Coping: Goal: Level of anxiety will decrease Outcome: Progressing   Problem: Pain Managment: Goal: General experience of comfort will improve Outcome: Progressing   

## 2018-03-25 ENCOUNTER — Inpatient Hospital Stay: Payer: BLUE CROSS/BLUE SHIELD

## 2018-03-25 DIAGNOSIS — K5732 Diverticulitis of large intestine without perforation or abscess without bleeding: Principal | ICD-10-CM

## 2018-03-25 LAB — CBC
HCT: 35.6 % (ref 35.0–47.0)
Hemoglobin: 12.2 g/dL (ref 12.0–16.0)
MCH: 29.8 pg (ref 26.0–34.0)
MCHC: 34.2 g/dL (ref 32.0–36.0)
MCV: 86.9 fL (ref 80.0–100.0)
PLATELETS: 266 10*3/uL (ref 150–440)
RBC: 4.1 MIL/uL (ref 3.80–5.20)
RDW: 13.5 % (ref 11.5–14.5)
WBC: 6.8 10*3/uL (ref 3.6–11.0)

## 2018-03-25 LAB — BASIC METABOLIC PANEL
Anion gap: 8 (ref 5–15)
CALCIUM: 8.8 mg/dL — AB (ref 8.9–10.3)
CO2: 29 mmol/L (ref 22–32)
Chloride: 102 mmol/L (ref 98–111)
Creatinine, Ser: 0.58 mg/dL (ref 0.44–1.00)
GFR calc Af Amer: >60 mL/min (ref >60)
Glucose, Bld: 95 mg/dL (ref 70–99)
Potassium: 4.1 mmol/L (ref 3.5–5.1)
SODIUM: 139 mmol/L (ref 135–145)

## 2018-03-25 MED ORDER — IOPAMIDOL (ISOVUE-300) INJECTION 61%
100.0000 mL | Freq: Once | INTRAVENOUS | Status: AC | PRN
  Administered 2018-03-25: 100 mL via INTRAVENOUS

## 2018-03-25 MED ORDER — IOPAMIDOL (ISOVUE-300) INJECTION 61%
15.0000 mL | INTRAVENOUS | Status: AC
  Administered 2018-03-25 (×2): 15 mL via ORAL

## 2018-03-25 NOTE — Plan of Care (Signed)
  Problem: Nutrition: Goal: Adequate nutrition will be maintained Outcome: Progressing   Problem: Coping: Goal: Level of anxiety will decrease Outcome: Progressing   Problem: Pain Managment: Goal: General experience of comfort will improve Outcome: Progressing   

## 2018-03-25 NOTE — Progress Notes (Signed)
[facility] [place] NAME: Joann Brown    MR#:  [id number]  DATE OF BIRTH:  July 07, 1965  SUBJECTIVE:  CHIEF COMPLAINT:   Chief Complaint  Patient presents with  . Abdominal Pain   Continues to have severe abdominal pain that she rates at 8/10.  Nausea. Afebrile.  Had 3 bowel movements in last 24 hours.  REVIEW OF SYSTEMS:  CONSTITUTIONAL: No fever, fatigue or weakness.  EYES: No blurred or double vision.  EARS, NOSE, AND THROAT: No tinnitus or ear pain.  RESPIRATORY: No cough, shortness of breath, wheezing or hemoptysis.  CARDIOVASCULAR: No chest pain, orthopnea, edema.  GASTROINTESTINAL: Positive for abdominal pain, nausea GENITOURINARY: No dysuria, hematuria.  ENDOCRINE: No polyuria, nocturia,  HEMATOLOGY: No anemia, easy bruising or bleeding SKIN: No rash or lesion. MUSCULOSKELETAL: No joint pain or arthritis.   NEUROLOGIC: No tingling, numbness, weakness.  PSYCHIATRY: No anxiety or depression.   ROS  DRUG ALLERGIES:   Allergies  Allergen Reactions  . Latex   . Morphine And Related   . Percocet [Oxycodone-Acetaminophen]   . Zosyn [Piperacillin Sod-Tazobactam So] Rash    Noted after IV inj in ER on 03/22/18    VITALS:  Blood pressure 103/60, pulse 68, temperature 98 F (36.7 C), temperature source Oral, resp. rate 18, height 4\' 11"  (1.499 m), weight 65.8 kg, SpO2 95 %.  PHYSICAL EXAMINATION:  GENERAL:  52 y.o.-year-old patient lying in the bed with no acute distress.  EYES: Pupils equal, round, reactive to light and accommodation. No scleral icterus. Extraocular muscles intact.  HEENT: Head atraumatic, normocephalic. Oropharynx and nasopharynx clear.  NECK:  Supple, no jugular venous distention. No thyroid enlargement, no tenderness.  LUNGS: Normal breath sounds bilaterally, no wheezing, rales,rhonchi or crepitation. No use of accessory muscles of respiration.  CARDIOVASCULAR: S1, S2 normal. No murmurs, rubs,  or gallops.  ABDOMEN: Soft, left side tender, nondistended. Bowel sounds sluggish . No organomegaly or mass.  EXTREMITIES: No pedal edema, cyanosis, or clubbing.  NEUROLOGIC: Cranial nerves II through XII are intact. Muscle strength 5/5 in all extremities. Sensation intact. Gait not checked.  PSYCHIATRIC: The patient is alert and oriented x 3.  SKIN: No obvious rash, lesion, or ulcer.   Physical Exam LABORATORY PANEL:   CBC Recent Labs  Lab 03/25/18 0327  WBC 6.8  HGB 12.2  HCT 35.6  PLT 266   ------------------------------------------------------------------------------------------------------------------  Chemistries  Recent Labs  Lab 03/22/18 0546  03/25/18 0327  NA 137   < > 139  K 3.5   < > 4.1  CL 101   < > 102  CO2 28   < > 29  GLUCOSE 101*   < > 95  BUN 19   < > <5*  CREATININE 0.64   < > 0.58  CALCIUM 8.8*   < > 8.8*  AST 24  --   --   ALT 27  --   --   ALKPHOS 74  --   --   BILITOT 0.8  --   --    < > = values in this interval not displayed.   ------------------------------------------------------------------------------------------------------------------  Cardiac Enzymes No results for input(s): TROPONINI in the last 168 hours. ------------------------------------------------------------------------------------------------------------------  RADIOLOGY:  No results found.  ASSESSMENT AND PLAN:   Principal Problem:   Diverticulitis  *Acute diverticulitis Initially on Zosyn.  Stopped as patient had itching.  Unclear if this was true allergy.  Now on IV ciprofloxacin and Flagyl. Zofran as needed.  Due to continued severe abdominal pain we will repeat CT scan of the abdomen with contrast to rule out abscess or any worsening.  N.p.o. at this time.  *Depression Continue amitriptyline, bupropion.  *Rashes Possible allergic reaction to Dilaudid.  Discontinued.  * Hypokalemia- replaced  All the records are reviewed and case discussed with Care  Management/Social Workerr. Management plans discussed with the patient, family and they are in agreement.  CODE STATUS: Full.  TOTAL TIME TAKING CARE OF THIS PATIENT:35  minutes.   POSSIBLE D/C IN 1-2 DAYS, DEPENDING ON CLINICAL CONDITION.  Neita Carp M.D on 03/25/2018   Between 7am to 6pm - Pager - [phone]  After 6pm go to [email or web address] - password [email or web address] [facility]  Office  [phone]  CC: Primary care physician; [facility], [place]  Note: This dictation was prepared with Diplomatic Services operational officer dictation along with smaller phrase technology. Any transcriptional errors that result from this process are unintentional.

## 2018-03-25 NOTE — Progress Notes (Signed)
   [name] , MD [address], [place], [place], [place], [id number] [address], [place], [place], [place], [address] Phone: [phone]  Fax: [phone]   Joann Brown is being followed for acute sigmoid diverticulitis  Day 1 of follow up   Subjective: Worsening of LLQ abdominal pain and vomiting    Objective: Vital signs in last 24 hours: Vitals:   03/24/18 1736 03/24/18 1738 03/24/18 2349 03/25/18 0832  BP: 122/81 119/65 122/69 103/60  Pulse: 72 66 72 68  Resp:   18   Temp:   97.7 F (36.5 C) 98 F (36.7 C)  TempSrc:   Oral Oral  SpO2:   100% 95%  Weight:      Height:       Weight change:   Intake/Output Summary (Last 24 hours) at 03/25/2018 1224 Last data filed at 03/25/2018 0931 Gross per 24 hour  Intake 2936.84 ml  Output -  Net 2936.84 ml     Exam: Heart:: Regular rate and rhythm, S1S2 present or without murmur or extra heart sounds Lungs: normal, clear to auscultation and clear to auscultation and percussion Abdomen: soft,mild LLQ tenderness , normal bowel sounds   Lab Results: @LABTEST2 @ Micro Results: No results found for this or any previous visit (from the past 240 hour(s)). Studies/Results: No results found. Medications: I have reviewed the patient's current medications. Scheduled Meds: . amitriptyline  10 mg Oral QHS  . buPROPion  150 mg Oral BID  . heparin  5,000 Units Subcutaneous Q8H  . iopamidol  15 mL Oral Q1 Hr x 2  . ondansetron  4 mg Oral TID AC  . pantoprazole  40 mg Oral BID  . potassium chloride  20 mEq Oral BID   Continuous Infusions: . sodium chloride 75 mL/hr at 03/25/18 1149  . ciprofloxacin 200 mL/hr at 03/25/18 0028  . famotidine (PEPCID) IV 20 mg (03/25/18 0935)  . metronidazole 500 mg (03/25/18 1151)   PRN Meds:.albuterol, bisacodyl, docusate sodium, HYDROcodone-acetaminophen, ketorolac, promethazine   Assessment: Principal Problem:   Diverticulitis  Joann Brown 52 y.o. female  admitted with acute sigmoid diverticulitis. On IV antibiotics. WBC normal. Clinically says pain is worse.   Plan: 1. Repeat CT scan of the abdomen and if worse or has perforation would suggest surgical evaluation    LOS: 3 days   [name], MD 03/25/2018, 12:24 PM

## 2018-03-26 MED ORDER — ALUM & MAG HYDROXIDE-SIMETH 200-200-20 MG/5ML PO SUSP
30.0000 mL | Freq: Four times a day (QID) | ORAL | Status: DC | PRN
  Administered 2018-03-26: 30 mL via ORAL
  Filled 2018-03-26: qty 30

## 2018-03-26 NOTE — Plan of Care (Signed)
  Problem: Education: Goal: Knowledge of General Education information will improve Description: Including pain rating scale, medication(s)/side effects and non-pharmacologic comfort measures Outcome: Progressing   Problem: Clinical Measurements: Goal: Will remain free from infection Outcome: Progressing   Problem: Nutrition: Goal: Adequate nutrition will be maintained Outcome: Progressing   Problem: Coping: Goal: Level of anxiety will decrease Outcome: Progressing   Problem: Pain Managment: Goal: General experience of comfort will improve Outcome: Progressing   

## 2018-03-26 NOTE — Progress Notes (Signed)
[facility] [place] NAME: Joann Brown    MR#:  [id number]  DATE OF BIRTH:  06/25/1965  SUBJECTIVE:  CHIEF COMPLAINT:   Chief Complaint  Patient presents with  . Abdominal Pain   Continues to have abdominal pain  No vomiting REVIEW OF SYSTEMS:  CONSTITUTIONAL: No fever, fatigue or weakness.  EYES: No blurred or double vision.  EARS, NOSE, AND THROAT: No tinnitus or ear pain.  RESPIRATORY: No cough, shortness of breath, wheezing or hemoptysis.  CARDIOVASCULAR: No chest pain, orthopnea, edema.  GASTROINTESTINAL: Positive for abdominal pain, nausea GENITOURINARY: No dysuria, hematuria.  ENDOCRINE: No polyuria, nocturia,  HEMATOLOGY: No anemia, easy bruising or bleeding SKIN: No rash or lesion. MUSCULOSKELETAL: No joint pain or arthritis.   NEUROLOGIC: No tingling, numbness, weakness.  PSYCHIATRY: No anxiety or depression.   ROS  DRUG ALLERGIES:   Allergies  Allergen Reactions  . Latex   . Morphine And Related   . Percocet [Oxycodone-Acetaminophen]   . Zosyn [Piperacillin Sod-Tazobactam So] Rash    Noted after IV inj in ER on 03/22/18    VITALS:  Blood pressure (!) 96/59, pulse 62, temperature 98.4 F (36.9 C), temperature source Oral, resp. rate 20, height 4\' 11"  (1.499 m), weight 65.8 kg, SpO2 95 %.  PHYSICAL EXAMINATION:  GENERAL:  52 y.o.-year-old patient lying in the bed with no acute distress.  EYES: Pupils equal, round, reactive to light and accommodation. No scleral icterus. Extraocular muscles intact.  HEENT: Head atraumatic, normocephalic. Oropharynx and nasopharynx clear.  NECK:  Supple, no jugular venous distention. No thyroid enlargement, no tenderness.  LUNGS: Normal breath sounds bilaterally, no wheezing, rales,rhonchi or crepitation. No use of accessory muscles of respiration.  CARDIOVASCULAR: S1, S2 normal. No murmurs, rubs, or gallops.  ABDOMEN: Soft, left side tender, nondistended. Bowel sounds  sluggish . No organomegaly or mass.  EXTREMITIES: No pedal edema, cyanosis, or clubbing.  NEUROLOGIC: Cranial nerves II through XII are intact. Muscle strength 5/5 in all extremities. Sensation intact. Gait not checked.  PSYCHIATRIC: The patient is alert and oriented x 3.  SKIN: No obvious rash, lesion, or ulcer.   Physical Exam LABORATORY PANEL:   CBC Recent Labs  Lab 03/25/18 0327  WBC 6.8  HGB 12.2  HCT 35.6  PLT 266   ------------------------------------------------------------------------------------------------------------------  Chemistries  Recent Labs  Lab 03/22/18 0546  03/25/18 0327  NA 137   < > 139  K 3.5   < > 4.1  CL 101   < > 102  CO2 28   < > 29  GLUCOSE 101*   < > 95  BUN 19   < > <5*  CREATININE 0.64   < > 0.58  CALCIUM 8.8*   < > 8.8*  AST 24  --   --   ALT 27  --   --   ALKPHOS 74  --   --   BILITOT 0.8  --   --    < > = values in this interval not displayed.   ------------------------------------------------------------------------------------------------------------------  Cardiac Enzymes No results for input(s): TROPONINI in the last 168 hours. ------------------------------------------------------------------------------------------------------------------  RADIOLOGY:  Ct Abdomen Pelvis W Contrast  Result Date: 03/25/2018 CLINICAL DATA:  Diverticulitis follow-up EXAM: CT ABDOMEN AND PELVIS WITH CONTRAST TECHNIQUE: Multidetector CT imaging of the abdomen and pelvis was performed using the standard protocol following bolus administration of intravenous contrast. CONTRAST:  136mL ISOVUE-300 IOPAMIDOL (ISOVUE-300) INJECTION 61% COMPARISON:  03/22/2018 FINDINGS: Lower chest: Subsegmental atelectasis  in the lingula. Stable tiny nodular focus along the major fissure may represent a small fissural lymph node measuring approximately 5 mm. This is unchanged. Hepatobiliary: Moderate gallbladder distention without mural thickening. Probable layering sludge  noted within. No secondary signs of acute cholecystitis. Homogeneous enhancement of the liver without biliary dilatation or mass. Pancreas: Unremarkable. No pancreatic ductal dilatation or surrounding inflammatory changes. Spleen: Normal in size without focal abnormality. Adrenals/Urinary Tract: Normal bilateral adrenal glands. Symmetric cortical enhancement of both kidneys. Probable mild reactive thickening along the left lateral wall of the bladder secondary to adjacent diverticular change. Stomach/Bowel: Interval decrease in pericolonic inflammation at site of previously described sigmoid diverticulitis. No abscess. No additional findings of note. Appendectomy. Vascular/Lymphatic: No significant vascular findings are present. No enlarged abdominal or pelvic lymph nodes. Reproductive: Status post hysterectomy. No adnexal masses. Other: Fat containing periumbilical hernia. No abdominopelvic ascites. Musculoskeletal: No acute nor aggressive osseous lesions. Lower lumbar facet arthrosis at L4-5 and L5-S1. Mild multilevel lumbar disc flattening. IMPRESSION: Interval decrease in pericolonic inflammation secondary to sigmoid diverticulitis. No development of pericolonic or intramural abscess nor bowel obstruction. No free air. Stable 5 mm nodular density along the left major fissure that may reflect a small fissural lymph node. Probable biliary sludge without secondary signs of acute cholecystitis. Electronically Signed   By: Ashley Royalty M.D.   On: 03/25/2018 15:07    ASSESSMENT AND PLAN:   Principal Problem:   Diverticulitis  *Acute diverticulitis Initially on Zosyn.  Stopped as patient had itching.  Unclear if this was true allergy.  Now on IV ciprofloxacin and Flagyl. Zofran as needed.  Still has abd pain but CT abd looks much improved. Will start clears today  *Depression Continue amitriptyline, bupropion.  *Rashes Possible allergic reaction to Dilaudid.  Discontinued.  * Hypokalemia-  replaced  All the records are reviewed and case discussed with Care Management/Social Workerr. Management plans discussed with the patient, family and they are in agreement.  CODE STATUS: Full.  TOTAL TIME TAKING CARE OF THIS PATIENT:35  minutes.   POSSIBLE D/C IN 1-2 DAYS, DEPENDING ON CLINICAL CONDITION.  Neita Carp M.D on 03/26/2018   Between 7am to 6pm - Pager - [phone]  After 6pm go to [email or web address] - password [email or web address] [facility]  Office  [phone]  CC: Primary care physician; [facility], [place]  Note: This dictation was prepared with Diplomatic Services operational officer dictation along with smaller phrase technology. Any transcriptional errors that result from this process are unintentional.

## 2018-03-27 MED ORDER — CIPROFLOXACIN HCL 500 MG PO TABS
500.0000 mg | ORAL_TABLET | Freq: Two times a day (BID) | ORAL | Status: DC
  Administered 2018-03-27: 500 mg via ORAL
  Filled 2018-03-27 (×2): qty 1

## 2018-03-27 MED ORDER — METRONIDAZOLE 500 MG PO TABS
500.0000 mg | ORAL_TABLET | Freq: Three times a day (TID) | ORAL | Status: DC
  Administered 2018-03-27: 500 mg via ORAL
  Filled 2018-03-27: qty 1

## 2018-03-27 MED ORDER — METRONIDAZOLE 500 MG PO TABS: 500.0000 mg | ORAL_TABLET | Freq: Three times a day (TID) | ORAL | 0 refills | Status: DC

## 2018-03-27 MED ORDER — HYDROCODONE-ACETAMINOPHEN 5-325 MG PO TABS: 1.0000 | ORAL_TABLET | Freq: Three times a day (TID) | ORAL | 0 refills | Status: DC | PRN

## 2018-03-27 MED ORDER — CIPROFLOXACIN HCL 500 MG PO TABS: 500.0000 mg | ORAL_TABLET | Freq: Two times a day (BID) | ORAL | 0 refills | Status: DC

## 2018-03-27 NOTE — Progress Notes (Signed)
Pt ready for discharge home today per MD. Patient assessment unchanged from this morning-no nausea or vomiting. Reviewed discharge instructions and prescriptions with pt; all questions answered and pt verbalized understanding. PIV removed, VSS. Pt assisted to car via NT.  Joann Brown

## 2018-03-27 NOTE — Discharge Instructions (Signed)
Full liquid diet for 2 to 3 days

## 2018-03-27 NOTE — Discharge Summary (Signed)
[facility] [place] NAME: Joann Brown    MR#:  [id number]  DATE OF BIRTH:  [birth year]  DATE OF ADMISSION:  03/22/2018 ADMITTING PHYSICIAN: Vaughan Basta, MD  DATE OF DISCHARGE: 03/27/2018 PRIMARY CARE PHYSICIAN: [facility] [place]    ADMISSION DIAGNOSIS:  Diverticulitis large intestine w/o perforation or abscess w/o bleeding [K57.32]  DISCHARGE DIAGNOSIS:  sigmoid diverticulitis  SECONDARY DIAGNOSIS:   Past Medical History:  Diagnosis Date  . Anxiety   . Asthma   . 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 Depression     HOSPITAL COURSE:   Joann Brown  is a 52 y.o. female with a known history of anxiety, asthma, hysterectomy due to cancer admitted with diverticulitis. Pain started 2 days ago, sharp, LLQ, non radiating, no previous history of similar symptoms.  No melena or hematochezia.  No diarrhea.  No fever chills.   *Acute sigmoid diverticulitis -Initially on Zosyn.  Stopped as patient had itching.  Unclear if this was true allergy.   -Now on IV ciprofloxacin and Flagyl-- change to oral antibiotic. Side effects of Flagyl discussed with patient. -Zofran as needed.  -Due to continued severe abdominal pain we will repeat CT scan of the abdomen with contrast  was done yesterday which is negative for any abscess. Shows improvement from initial CT. Patient overall clinically feels better as well. She is agreeable to advanced to full liquid diet which I have asked her to continue when she goes home for another several days. No blood in stool.  *Depression -Continue amitriptyline,bupropion.  *Rashes -Possible allergic reaction to Dilaudid.  Discontinued.  -* Hypokalemia- replaced  Clearly appears to be improving. Patient will discharged to home later today with outpatient follow-up with Dr. Bonna Gains CONSULTS OBTAINED:  Treatment Team:   Virgel Manifold, MD  DRUG ALLERGIES:   Allergies  Allergen Reactions  . Latex   . Morphine And Related   . Percocet [Oxycodone-Acetaminophen]   . Zosyn [Piperacillin Sod-Tazobactam So] Rash    Noted after IV inj in ER on 03/22/18    DISCHARGE MEDICATIONS:   Allergies as of 03/27/2018      Reactions   Latex    Morphine And Related    Percocet [oxycodone-acetaminophen]    Zosyn [piperacillin Sod-tazobactam So] Rash   Noted after IV inj in ER on 03/22/18      Medication List    STOP taking these medications   diazepam 5 MG tablet Commonly known as:  VALIUM   ondansetron 4 MG disintegrating tablet Commonly known as:  ZOFRAN-ODT   oxyCODONE 10 mg 12 hr tablet Commonly known as:  OXYCONTIN     TAKE these medications   albuterol 108 (90 Base) MCG/ACT inhaler Commonly known as:  PROVENTIL HFA;VENTOLIN HFA Inhale 2 puffs into the lungs every 6 (six) hours as needed for wheezing or shortness of breath.   amitriptyline 10 MG tablet Commonly known as:  ELAVIL Take 10 mg by mouth at bedtime.   buPROPion 150 MG 12 hr tablet Commonly known as:  ZYBAN Take 150 mg by mouth 2 (two) times daily.   ciprofloxacin 500 MG tablet Commonly known as:  CIPRO Take 1 tablet (500 mg total) by mouth 2 (two) times daily.   cyclobenzaprine 5 MG tablet Commonly known as:  FLEXERIL Take 1 tablet (5 mg total) by mouth 3 (three) times daily as needed for muscle spasms.  HYDROcodone-acetaminophen 5-325 MG tablet Commonly known as:  NORCO/VICODIN Take 1 tablet by mouth 3 (three) times daily as needed for moderate pain.   metroNIDAZOLE 500 MG tablet Commonly known as:  FLAGYL Take 1 tablet (500 mg total) by mouth every 8 (eight) hours.   Vitamin D (Ergocalciferol) 50000 units Caps capsule Commonly known as:  DRISDOL Take 50,000 Units by mouth every Tuesday.       If you experience worsening of your admission symptoms, develop shortness of breath, life threatening emergency,  suicidal or homicidal thoughts you must seek medical attention immediately by calling 911 or calling your MD immediately  if symptoms less severe.  You Must read complete instructions/literature along with all the possible adverse reactions/side effects for all the Medicines you take and that have been prescribed to you. Take any new Medicines after you have completely understood and accept all the possible adverse reactions/side effects.   Please note  You were cared for by a hospitalist during your hospital stay. If you have any questions about your discharge medications or the care you received while you were in the hospital after you are discharged, you can call the unit and asked to speak with the hospitalist on call if the hospitalist that took care of you is not available. Once you are discharged, your primary care physician will handle any further medical issues. Please note that NO REFILLS for any discharge medications will be authorized once you are discharged, as it is imperative that you return to your primary care physician (or establish a relationship with a primary care physician if you do not have one) for your aftercare needs so that they can reassess your need for medications and monitor your lab values. Today   SUBJECTIVE   Pain improving left lower quadrant. Ambulating in the room. No vomiting. Tolerating liquid diet.  VITAL SIGNS:  Blood pressure (!) 99/52, pulse 68, temperature (!) 97.4 F (36.3 C), temperature source Oral, resp. rate 20, height 4\' 11"  (1.499 m), weight 65.8 kg, SpO2 98 %.  I/O:    Intake/Output Summary (Last 24 hours) at 03/27/2018 0935 Last data filed at 03/27/2018 0800 Gross per 24 hour  Intake 3513.95 ml  Output -  Net 3513.95 ml    PHYSICAL EXAMINATION:  GENERAL:  52 y.o.-year-old patient lying in the bed with no acute distress.  EYES: Pupils equal, round, reactive to light and accommodation. No scleral icterus. Extraocular muscles intact.   HEENT: Head atraumatic, normocephalic. Oropharynx and nasopharynx clear.  NECK:  Supple, no jugular venous distention. No thyroid enlargement, no tenderness.  LUNGS: Normal breath sounds bilaterally, no wheezing, rales,rhonchi or crepitation. No use of accessory muscles of respiration.  CARDIOVASCULAR: S1, S2 normal. No murmurs, rubs, or gallops.  ABDOMEN: Soft, non-tender, non-distended. Bowel sounds present. No organomegaly or mass.  EXTREMITIES: No pedal edema, cyanosis, or clubbing.  NEUROLOGIC: Cranial nerves II through XII are intact. Muscle strength 5/5 in all extremities. Sensation intact. Gait not checked.  PSYCHIATRIC: The patient is alert and oriented x 3.  SKIN: No obvious rash, lesion, or ulcer.   DATA REVIEW:   CBC  Recent Labs  Lab 03/25/18 0327  WBC 6.8  HGB 12.2  HCT 35.6  PLT 266    Chemistries  Recent Labs  Lab 03/22/18 0546  03/25/18 0327  NA 137   < > 139  K 3.5   < > 4.1  CL 101   < > 102  CO2 28   < > 29  GLUCOSE 101*   < > 95  BUN 19   < > <5*  CREATININE 0.64   < > 0.58  CALCIUM 8.8*   < > 8.8*  AST 24  --   --   ALT 27  --   --   ALKPHOS 74  --   --   BILITOT 0.8  --   --    < > = values in this interval not displayed.    Microbiology Results   No results found for this or any previous visit (from the past 240 hour(s)).  RADIOLOGY:  Ct Abdomen Pelvis W Contrast  Result Date: 03/25/2018 CLINICAL DATA:  Diverticulitis follow-up EXAM: CT ABDOMEN AND PELVIS WITH CONTRAST TECHNIQUE: Multidetector CT imaging of the abdomen and pelvis was performed using the standard protocol following bolus administration of intravenous contrast. CONTRAST:  160mL ISOVUE-300 IOPAMIDOL (ISOVUE-300) INJECTION 61% COMPARISON:  03/22/2018 FINDINGS: Lower chest: Subsegmental atelectasis in the lingula. Stable tiny nodular focus along the major fissure may represent a small fissural lymph node measuring approximately 5 mm. This is unchanged. Hepatobiliary: Moderate  gallbladder distention without mural thickening. Probable layering sludge noted within. No secondary signs of acute cholecystitis. Homogeneous enhancement of the liver without biliary dilatation or mass. Pancreas: Unremarkable. No pancreatic ductal dilatation or surrounding inflammatory changes. Spleen: Normal in size without focal abnormality. Adrenals/Urinary Tract: Normal bilateral adrenal glands. Symmetric cortical enhancement of both kidneys. Probable mild reactive thickening along the left lateral wall of the bladder secondary to adjacent diverticular change. Stomach/Bowel: Interval decrease in pericolonic inflammation at site of previously described sigmoid diverticulitis. No abscess. No additional findings of note. Appendectomy. Vascular/Lymphatic: No significant vascular findings are present. No enlarged abdominal or pelvic lymph nodes. Reproductive: Status post hysterectomy. No adnexal masses. Other: Fat containing periumbilical hernia. No abdominopelvic ascites. Musculoskeletal: No acute nor aggressive osseous lesions. Lower lumbar facet arthrosis at L4-5 and L5-S1. Mild multilevel lumbar disc flattening. IMPRESSION: Interval decrease in pericolonic inflammation secondary to sigmoid diverticulitis. No development of pericolonic or intramural abscess nor bowel obstruction. No free air. Stable 5 mm nodular density along the left major fissure that may reflect a small fissural lymph node. Probable biliary sludge without secondary signs of acute cholecystitis. Electronically Signed   By: Ashley Royalty M.D.   On: 03/25/2018 15:07     Management plans discussed with the patient, family and they are in agreement.  CODE STATUS:     Code Status Orders  (From admission, onward)         Start     Ordered   03/22/18 1432  Full code  Continuous     03/22/18 1432        Code Status History    Date Active Date Inactive Code Status Order ID Comments User Context   12/15/2017 0201 12/17/2017 1412 Full  Code [id number]  Lance Coon, MD Inpatient      TOTAL TIME TAKING CARE OF THIS PATIENT: 40* minutes.    Fritzi Mandes M.D on 03/27/2018 at 9:35 AM  Between 7am to 6pm - Pager - [phone] After 6pm go to [email or web address] - password [email or web address] [facility]  Office  [phone]  CC: Primary care physician; [facility], [place]

## 2018-03-30 ENCOUNTER — Telehealth: Payer: Self-pay

## 2018-03-30 NOTE — Telephone Encounter (Signed)
EMMI Follow-up: Noted on the report that the patient had read her discharge paperwork and question about transportation to follow-up.  I talked with Ms. Joann Brown and she had her Rx's filled and aware of follow-up appointments. Said she was still feeling dizzy, has low energy and pain in her stomach.  Encouraged her to talk with MD at her appointment tomorrow about these issues and she agreed.  I let her know there would be another automated call with a different series of questions and to let us know if she had any other concerns at that time.

## 2018-04-03 ENCOUNTER — Telehealth: Payer: Self-pay | Admitting: Licensed Clinical Social Worker

## 2018-04-03 NOTE — Telephone Encounter (Signed)
EMMI flagged patient for answering yes to loss of interest in things and yes to feeling sad/hopeless/anxious/empty. Clinical Education officer, museum (CSW) contacted patient via telephone. Per patient she is doing good and went to her PCP last Friday and had some medication changed. Per patient her PCP has referred her to a counselor for her anxiety and depression. Per patient she has a history of anxiety and depression and is on medications for anxiety and depression. Per patient the medications work however she would like to go see a Social worker. CSW provided patient with location and contact information on [facility] (outpatient mental health clinic). Per patient she will follow up with [facility]. Patient reported that she is not having thoughts of hurting herself. CSW provided emotional support. Patient reported no other needs or concerns.   [employer], LCSW [phone]

## 2018-04-09 ENCOUNTER — Other Ambulatory Visit: Payer: Self-pay

## 2018-04-09 ENCOUNTER — Emergency Department
Admission: EM | Admit: 2018-04-09 | Discharge: 2018-04-10 | Disposition: A | Discharge status: Home / Self Care | Payer: BLUE CROSS/BLUE SHIELD | Attending: Emergency Medicine | Admitting: Emergency Medicine

## 2018-04-09 DIAGNOSIS — J45909 Unspecified asthma, uncomplicated: Secondary | ICD-10-CM | POA: Insufficient documentation

## 2018-04-09 DIAGNOSIS — M5432 Sciatica, left side: Secondary | ICD-10-CM | POA: Diagnosis not present

## 2018-04-09 DIAGNOSIS — M545 Low back pain: Secondary | ICD-10-CM | POA: Diagnosis present

## 2018-04-09 DIAGNOSIS — M5431 Sciatica, right side: Secondary | ICD-10-CM | POA: Diagnosis not present

## 2018-04-09 DIAGNOSIS — Z79899 Other long term (current) drug therapy: Secondary | ICD-10-CM | POA: Diagnosis not present

## 2018-04-09 DIAGNOSIS — Z91040 Latex allergy status: Secondary | ICD-10-CM | POA: Diagnosis not present

## 2018-04-09 MED ORDER — DEXAMETHASONE SODIUM PHOSPHATE 10 MG/ML IJ SOLN
20.0000 mg | Freq: Once | INTRAMUSCULAR | Status: AC
  Administered 2018-04-09: 20 mg via INTRAMUSCULAR
  Filled 2018-04-09: qty 2

## 2018-04-09 MED ORDER — METHOCARBAMOL 500 MG PO TABS: 500.0000 mg | ORAL_TABLET | Freq: Three times a day (TID) | ORAL | 0 refills | Status: DC | PRN

## 2018-04-09 MED ORDER — PREDNISONE 50 MG PO TABS: ORAL_TABLET | ORAL | 0 refills | Status: DC

## 2018-04-09 MED ORDER — METHOCARBAMOL 500 MG PO TABS
1000.0000 mg | ORAL_TABLET | Freq: Once | ORAL | Status: AC
  Administered 2018-04-09: 1000 mg via ORAL
  Filled 2018-04-09: qty 2

## 2018-04-09 NOTE — ED Notes (Signed)
Pt  Reports  Pain in both  Legs  X  5 days  Getting  Worse    Pt also  Reports  Neck and  Low  Back  -  Has   Neck problems  Seeing emergent ortho  denys any recent injury

## 2018-04-09 NOTE — ED Triage Notes (Addendum)
Pt comes via POV from home with c/o bilateral leg pain. Pt states this started last Wednesday. Pt denies any recent long trips or injuries.  Pt states pain that starts at the top of her back and runs down to both legs.

## 2018-04-09 NOTE — ED Provider Notes (Signed)
[facility] Emergency Department Provider Note  ____________________________________________  Time seen: Approximately 11:03 PM  I have reviewed the triage vital signs and the nursing notes.   HISTORY  Chief Complaint Leg Pain    HPI Joann Brown is a 52 y.o. female with a history of anxiety, depression presents to the emergency department with low back pain with bilateral lower extremity radiculopathy, right worse than left for the past five days. No dysuria, hematuria, increased urinary frequency, nausea or vomiting.  Patient denies falls or recent traumas.  No subjective weakness.  Patient has been able to ambulate without difficulty.  She denies bowel or bladder incontinence or saddle anesthesia. No chest pain, chest tightness of abdominal pain.    Past Medical History:  Diagnosis Date  . Anxiety   . Asthma   . Cancer [facility]) 2007    hysterectomy to remove tumor in uterus   . Depression     Patient Active Problem List   Diagnosis Date Noted  . Diverticulitis 03/22/2018  . Black widow spider bite 12/15/2017  . Depression with anxiety 12/15/2017    Past Surgical History:  Procedure Laterality Date  . ABDOMINAL HYSTERECTOMY     2/2 tumor in uterus   . APPENDECTOMY  1986  . brain aneurysm coiling    . BRAIN SURGERY  2014   coil placement for brain aneurysm   . TONSILLECTOMY     52 years old    Prior to Admission medications   Medication Sig Start Date End Date Taking? Authorizing Provider  albuterol (PROVENTIL HFA;VENTOLIN HFA) 108 (90 Base) MCG/ACT inhaler Inhale 2 puffs into the lungs every 6 (six) hours as needed for wheezing or shortness of breath.     [provider]  amitriptyline (ELAVIL) 10 MG tablet Take 10 mg by mouth at bedtime.    [provider]  buPROPion (ZYBAN) 150 MG 12 hr tablet Take 150 mg by mouth 2 (two) times daily.    [provider]  ciprofloxacin (CIPRO) 500 MG tablet Take 1 tablet  (500 mg total) by mouth 2 (two) times daily. 03/27/18   Fritzi Mandes, MD  cyclobenzaprine (FLEXERIL) 5 MG tablet Take 1 tablet (5 mg total) by mouth 3 (three) times daily as needed for muscle spasms. Patient not taking: Reported on 03/22/2018 12/16/17   Vaughan Basta, MD  HYDROcodone-acetaminophen (NORCO/VICODIN) 5-325 MG tablet Take 1 tablet by mouth 3 (three) times daily as needed for moderate pain. 03/27/18 03/27/19  Fritzi Mandes, MD  methocarbamol (ROBAXIN) 500 MG tablet Take 1 tablet (500 mg total) by mouth 3 (three) times daily as needed for up to 5 days. 04/09/18 04/14/18  Lannie Fields, PA-C  metroNIDAZOLE (FLAGYL) 500 MG tablet Take 1 tablet (500 mg total) by mouth every 8 (eight) hours. 03/27/18   Fritzi Mandes, MD  predniSONE (DELTASONE) 50 MG tablet Take one 50 mg tablet once daily for the next five days. 04/09/18   Lannie Fields, PA-C  Vitamin D, Ergocalciferol, (DRISDOL) 50000 units CAPS capsule Take 50,000 Units by mouth every Tuesday.    [provider]    Allergies Latex; Morphine and related; Percocet [oxycodone-acetaminophen]; and Zosyn [piperacillin sod-tazobactam so]  Family History  Problem Relation Age of Onset  . Heart disease Father   . Heart disease Maternal Grandfather   . Heart disease Paternal Grandfather   . Breast cancer Sister     Social History Social History   Tobacco Use  . Smoking status: Never Smoker  .  Smokeless tobacco: Never Used  Substance Use Topics  . Alcohol use: Not Currently  . Drug use: Not Currently     Review of Systems  Constitutional: No fever/chills Eyes: No visual changes. No discharge ENT: No upper respiratory complaints. Cardiovascular: no chest pain. Respiratory: no cough. No SOB. Gastrointestinal: No abdominal pain.  No nausea, no vomiting.  No diarrhea.  No constipation. Genitourinary: Negative for dysuria. No hematuria Musculoskeletal: Patient has low back pain.  Skin: Negative for rash, abrasions,  lacerations, ecchymosis. Neurological: Negative for headaches, focal weakness or numbness.   ____________________________________________   PHYSICAL EXAM:  VITAL SIGNS: ED Triage Vitals [04/09/18 2158]  Enc Vitals Group     BP 133/90     Pulse Rate (!) 105     Resp      Temp 97.9 F (36.6 C)     Temp Source Oral     SpO2 99 %     Weight 147 lb (66.7 kg)     Height 4\' 11"  (1.499 m)     Head Circumference      Peak Flow      Pain Score 10     Pain Loc      Pain Edu?      Excl. in [place]?      Constitutional: Alert and oriented. Well appearing and in no acute distress. Eyes: Conjunctivae are normal. PERRL. EOMI. Head: Atraumatic. Neck: No stridor.  No cervical spine tenderness to palpation. Cardiovascular: Normal rate, regular rhythm. Normal S1 and S2.  Good peripheral circulation. Respiratory: Normal respiratory effort without tachypnea or retractions. Lungs CTAB. Good air entry to the bases with no decreased or absent breath sounds. Gastrointestinal: Bowel sounds 4 quadrants. Soft and nontender to palpation. No guarding or rigidity. No palpable masses. No distention. No CVA tenderness. Musculoskeletal: Full range of motion to all extremities.  5 out of 5 strength in the upper and lower extremities bilaterally.  Positive straight leg raise bilaterally. Neurologic:  Normal speech and language. No gross focal neurologic deficits are appreciated.  Skin:  Skin is warm, dry and intact. No rash noted. Psychiatric: Mood and affect are normal. Speech and behavior are normal. Patient exhibits appropriate insight and judgement.   ____________________________________________   LABS (all labs ordered are listed, but only abnormal results are displayed)  Labs Reviewed - No data to display ____________________________________________  EKG   ____________________________________________  RADIOLOGY   No results  found.  ____________________________________________    PROCEDURES  Procedure(s) performed:    Procedures    Medications  methocarbamol (ROBAXIN) tablet 1,000 mg (1,000 mg Oral Given 04/09/18 2314)  dexamethasone (DECADRON) injection 20 mg (20 mg Intramuscular Given 04/09/18 2315)     ____________________________________________   INITIAL IMPRESSION / ASSESSMENT AND PLAN / ED COURSE  Pertinent labs & imaging results that were available during my care of the patient were reviewed by me and considered in my medical decision making (see chart for details).  Review of the [place] CSRS was performed in accordance of the [place] prior to dispensing any controlled drugs.      Assessment and Plan:  Low Back Pain:  Patient presents to the emergency department with low back pain with bilateral lower extremity radiculopathy for the past 5 days.  History and physical exam findings are consistent with sciatica.  Patient was given Decadron and Robaxin in the emergency department.  Patient was discharged with prednisone and Robaxin.  She was given return precautions and advised to follow-up with [facility] clinic as needed.  All patient questions were answered.    ____________________________________________  FINAL CLINICAL IMPRESSION(S) / ED DIAGNOSES  Final diagnoses:  Bilateral sciatica      NEW MEDICATIONS STARTED DURING THIS VISIT:  ED Discharge Orders         Ordered    predniSONE (DELTASONE) 50 MG tablet     04/09/18 2343    methocarbamol (ROBAXIN) 500 MG tablet  3 times daily PRN     04/09/18 2343              This chart was dictated using voice recognition software/Dragon. Despite best efforts to proofread, errors can occur which can change the meaning. Any change was purely unintentional.    Lannie Fields, PA-C 04/09/18 2349    Lavonia Drafts, MD 04/12/18 [phone]

## 2018-04-12 ENCOUNTER — Ambulatory Visit (INDEPENDENT_AMBULATORY_CARE_PROVIDER_SITE_OTHER): Payer: BLUE CROSS/BLUE SHIELD | Admitting: Gastroenterology

## 2018-04-12 VITALS — BP 116/77 | HR 86 | Ht 59.0 in | Wt 141.6 lb

## 2018-04-12 DIAGNOSIS — K5732 Diverticulitis of large intestine without perforation or abscess without bleeding: Secondary | ICD-10-CM

## 2018-04-12 NOTE — Progress Notes (Signed)
Vonda Antigua, MD [address]  [place]  [place], [place] [address]  Main: [phone]  Fax: [phone]   Primary Care Physician: [facility]  Primary Gastroenterologist:  Dr. Vonda Antigua Chief complaint: Follow-up for diverticulitis  HPI: Joann Brown is a 52 y.o. female recently admitted to the hospital in October 2018 when she presented with abdominal pain was found to have diverticulitis based on CT findings on March 22, 2018 that showed diverticulitis involving the proximal to mid sigmoid colon with with wall thickening and mesenteric stranding.  No perforation or abscess..  Patient also found to have bladder wall thickening or cystitis on CT, however it was reported that it was seen adjacent to the diverticulitis and may be in part due to inflammation from nearby diverticulitis.  CT abdomen was repeated on October 5 due to ongoing abdominal pain, and showed interval decrease in pericolonic inflammation secondary to sigmoid diverticulitis.  No abscess or bowel obstruction or free air.  Last colonoscopy, June 2017, available in care everywhere, normal terminal ileum, 2 mm hepatic flexure polyp removed, 2 rectal polyps removed, multiple diverticuli reported.  Pathology report not available.  Repeat recommended in 5 years.  Patient now reports improvement of abdominal pain.  Pain is intermittent, cramping, 3/10, left lower quadrant.  No nausea or vomiting.  No altered bowel habits.  No hematochezia or melena.  Current Outpatient Medications  Medication Sig Dispense Refill  . albuterol (PROVENTIL HFA;VENTOLIN HFA) 108 (90 Base) MCG/ACT inhaler Inhale 2 puffs into the lungs every 6 (six) hours as needed for wheezing or shortness of breath.     Marland Kitchen amitriptyline (ELAVIL) 10 MG tablet Take 10 mg by mouth at bedtime.    Marland Kitchen buPROPion (ZYBAN) 150 MG 12 hr tablet Take 150 mg by mouth 2 (two) times daily.    . ciprofloxacin (CIPRO) 500 MG  tablet Take 1 tablet (500 mg total) by mouth 2 (two) times daily. 8 tablet 0  . cyclobenzaprine (FLEXERIL) 5 MG tablet Take 1 tablet (5 mg total) by mouth 3 (three) times daily as needed for muscle spasms. (Patient not taking: Reported on 03/22/2018) 20 tablet 0  . HYDROcodone-acetaminophen (NORCO/VICODIN) 5-325 MG tablet Take 1 tablet by mouth 3 (three) times daily as needed for moderate pain. 20 tablet 0  . methocarbamol (ROBAXIN) 500 MG tablet Take 1 tablet (500 mg total) by mouth 3 (three) times daily as needed for up to 5 days. 15 tablet 0  . metroNIDAZOLE (FLAGYL) 500 MG tablet Take 1 tablet (500 mg total) by mouth every 8 (eight) hours. 12 tablet 0  . predniSONE (DELTASONE) 50 MG tablet Take one 50 mg tablet once daily for the next five days. 5 tablet 0  . Vitamin D, Ergocalciferol, (DRISDOL) 50000 units CAPS capsule Take 50,000 Units by mouth every Tuesday.     No current facility-administered medications for this visit.     Allergies as of 04/12/2018 - Review Complete 04/09/2018  Allergen Reaction Noted  . Latex  12/01/2016  . Morphine and related  12/01/2016  . Percocet [oxycodone-acetaminophen]  12/01/2016  . Zosyn [piperacillin sod-tazobactam so] Rash 03/22/2018    ROS:  General: Negative for anorexia, weight loss, fever, chills, fatigue, weakness. ENT: Negative for hoarseness, difficulty swallowing , nasal congestion. CV: Negative for chest pain, angina, palpitations, dyspnea on exertion, peripheral edema.  Respiratory: Negative for dyspnea at rest, dyspnea on exertion, cough, sputum, wheezing.  GI: See history of present illness. GU:  Negative for dysuria,  hematuria, urinary incontinence, urinary frequency, nocturnal urination.  Endo: Negative for unusual weight change.    Physical Examination:   There were no vitals taken for this visit.  General: Well-nourished, well-developed in no acute distress.  Eyes: No icterus. Conjunctivae pink. Mouth: Oropharyngeal mucosa  moist and pink , no lesions erythema or exudate. Neck: Supple, Trachea midline Abdomen: Bowel sounds are normal, nontender, nondistended, no hepatosplenomegaly or masses, no abdominal bruits or hernia , no rebound or guarding.   Extremities: No lower extremity edema. No clubbing or deformities. Neuro: Alert and oriented x 3.  Grossly intact. Skin: Warm and dry, no jaundice.   Psych: Alert and cooperative, normal mood and affect.   Labs: CMP     Component Value Date/Time   NA 139 03/25/2018 0327   K 4.1 03/25/2018 0327   CL 102 03/25/2018 0327   CO2 29 03/25/2018 0327   GLUCOSE 95 03/25/2018 0327   BUN <5 (L) 03/25/2018 0327   CREATININE 0.58 03/25/2018 0327   CALCIUM 8.8 (L) 03/25/2018 0327   PROT 7.6 03/22/2018 0546   ALBUMIN 4.4 03/22/2018 0546   AST 24 03/22/2018 0546   ALT 27 03/22/2018 0546   ALKPHOS 74 03/22/2018 0546   BILITOT 0.8 03/22/2018 0546   GFRNONAA >60 03/25/2018 0327   GFRAA >60 03/25/2018 0327   Lab Results  Component Value Date   WBC 6.8 03/25/2018   HGB 12.2 03/25/2018   HCT 35.6 03/25/2018   MCV 86.9 03/25/2018   PLT 266 03/25/2018    Imaging Studies: Ct Abdomen Pelvis W Contrast  Result Date: 03/25/2018 CLINICAL DATA:  Diverticulitis follow-up EXAM: CT ABDOMEN AND PELVIS WITH CONTRAST TECHNIQUE: Multidetector CT imaging of the abdomen and pelvis was performed using the standard protocol following bolus administration of intravenous contrast. CONTRAST:  13mL ISOVUE-300 IOPAMIDOL (ISOVUE-300) INJECTION 61% COMPARISON:  03/22/2018 FINDINGS: Lower chest: Subsegmental atelectasis in the lingula. Stable tiny nodular focus along the major fissure may represent a small fissural lymph node measuring approximately 5 mm. This is unchanged. Hepatobiliary: Moderate gallbladder distention without mural thickening. Probable layering sludge noted within. No secondary signs of acute cholecystitis. Homogeneous enhancement of the liver without biliary dilatation or mass.  Pancreas: Unremarkable. No pancreatic ductal dilatation or surrounding inflammatory changes. Spleen: Normal in size without focal abnormality. Adrenals/Urinary Tract: Normal bilateral adrenal glands. Symmetric cortical enhancement of both kidneys. Probable mild reactive thickening along the left lateral wall of the bladder secondary to adjacent diverticular change. Stomach/Bowel: Interval decrease in pericolonic inflammation at site of previously described sigmoid diverticulitis. No abscess. No additional findings of note. Appendectomy. Vascular/Lymphatic: No significant vascular findings are present. No enlarged abdominal or pelvic lymph nodes. Reproductive: Status post hysterectomy. No adnexal masses. Other: Fat containing periumbilical hernia. No abdominopelvic ascites. Musculoskeletal: No acute nor aggressive osseous lesions. Lower lumbar facet arthrosis at L4-5 and L5-S1. Mild multilevel lumbar disc flattening. IMPRESSION: Interval decrease in pericolonic inflammation secondary to sigmoid diverticulitis. No development of pericolonic or intramural abscess nor bowel obstruction. No free air. Stable 5 mm nodular density along the left major fissure that may reflect a small fissural lymph node. Probable biliary sludge without secondary signs of acute cholecystitis. Electronically Signed   By: Ashley Royalty M.D.   On: 03/25/2018 15:07   US Breast Ltd Uni Left Inc Axilla  Result Date: 03/14/2018 CLINICAL DATA:  52 year old female presenting for evaluation of focal pain and palpable lumps in the upper-outer left breast. This has been present for 2-3 weeks. She has family history of  breast cancer in her sister who was diagnosed this year. EXAM: DIGITAL DIAGNOSTIC BILATERAL MAMMOGRAM WITH CAD AND TOMO ULTRASOUND BILATERAL BREAST COMPARISON:  Previous exam(s). ACR Breast Density Category c: The breast tissue is heterogeneously dense, which may obscure small masses. FINDINGS: BBs have been placed along the upper-outer  quadrants of the breasts bilaterally indicating the tender palpable sites of concern. No suspicious mammographic findings are identified deep to either of these skin markers. No suspicious calcifications, masses or areas of distortion are seen in the bilateral breasts. Mammographic images were processed with CAD. On physical exam, no suspicious palpable masses are identified in the lateral to upper-outer breasts bilaterally. Ultrasound of the upper-outer quadrant of the left breast demonstrates normal fibroglandular tissue. No masses or suspicious areas of shadowing are identified. Ultrasound of the upper-outer quadrant of the right breast demonstrates no suspicious masses at the site of pain which is at the 9 o'clock location in the right breast. However, superior to this at 10 o'clock, 5 cm from the nipple is an oval circumscribed hypoechoic mass measuring 1.2 x 0.6 x 1.2 cm. No blood flow seen within the mass on color Doppler imaging. IMPRESSION: 1. There are no mammographic or targeted sonographic abnormalities in the bilateral lateral to upper-outer breast to explain the patient's breast pain. 2. There is a probably benign mass in the right breast at 10 o'clock, favored to represent a fibroadenoma. 3.  No evidence of malignancy in the bilateral breasts. RECOMMENDATION: 1. Clinical follow-up recommended for the tender palpable areas in the upper outer breasts bilaterally. Any further workup should be based on clinical grounds. 2. Six-month follow-up right breast ultrasound is recommended for the probably benign mass at 10 o'clock. The patient is interested in a reduction mammoplasty. If she decides to pursue this soon, we can biopsy the mass rather than follow-up. I have discussed the findings and recommendations with the patient. Results were also provided in writing at the conclusion of the visit. If applicable, a reminder letter will be sent to the patient regarding the next appointment. BI-RADS CATEGORY  3:  Probably benign. Electronically Signed   By: Ammie Ferrier M.D.   On: 03/14/2018 16:19   US Breast Ltd Uni Right Inc Axilla  Result Date: 03/14/2018 CLINICAL DATA:  52 year old female presenting for evaluation of focal pain and palpable lumps in the upper-outer left breast. This has been present for 2-3 weeks. She has family history of breast cancer in her sister who was diagnosed this year. EXAM: DIGITAL DIAGNOSTIC BILATERAL MAMMOGRAM WITH CAD AND TOMO ULTRASOUND BILATERAL BREAST COMPARISON:  Previous exam(s). ACR Breast Density Category c: The breast tissue is heterogeneously dense, which may obscure small masses. FINDINGS: BBs have been placed along the upper-outer quadrants of the breasts bilaterally indicating the tender palpable sites of concern. No suspicious mammographic findings are identified deep to either of these skin markers. No suspicious calcifications, masses or areas of distortion are seen in the bilateral breasts. Mammographic images were processed with CAD. On physical exam, no suspicious palpable masses are identified in the lateral to upper-outer breasts bilaterally. Ultrasound of the upper-outer quadrant of the left breast demonstrates normal fibroglandular tissue. No masses or suspicious areas of shadowing are identified. Ultrasound of the upper-outer quadrant of the right breast demonstrates no suspicious masses at the site of pain which is at the 9 o'clock location in the right breast. However, superior to this at 10 o'clock, 5 cm from the nipple is an oval circumscribed hypoechoic mass measuring 1.2 x  0.6 x 1.2 cm. No blood flow seen within the mass on color Doppler imaging. IMPRESSION: 1. There are no mammographic or targeted sonographic abnormalities in the bilateral lateral to upper-outer breast to explain the patient's breast pain. 2. There is a probably benign mass in the right breast at 10 o'clock, favored to represent a fibroadenoma. 3.  No evidence of malignancy in the  bilateral breasts. RECOMMENDATION: 1. Clinical follow-up recommended for the tender palpable areas in the upper outer breasts bilaterally. Any further workup should be based on clinical grounds. 2. Six-month follow-up right breast ultrasound is recommended for the probably benign mass at 10 o'clock. The patient is interested in a reduction mammoplasty. If she decides to pursue this soon, we can biopsy the mass rather than follow-up. I have discussed the findings and recommendations with the patient. Results were also provided in writing at the conclusion of the visit. If applicable, a reminder letter will be sent to the patient regarding the next appointment. BI-RADS CATEGORY  3: Probably benign. Electronically Signed   By: Ammie Ferrier M.D.   On: 03/14/2018 16:19   Mm Diag Breast Tomo Bilateral  Result Date: 03/14/2018 CLINICAL DATA:  52 year old female presenting for evaluation of focal pain and palpable lumps in the upper-outer left breast. This has been present for 2-3 weeks. She has family history of breast cancer in her sister who was diagnosed this year. EXAM: DIGITAL DIAGNOSTIC BILATERAL MAMMOGRAM WITH CAD AND TOMO ULTRASOUND BILATERAL BREAST COMPARISON:  Previous exam(s). ACR Breast Density Category c: The breast tissue is heterogeneously dense, which may obscure small masses. FINDINGS: BBs have been placed along the upper-outer quadrants of the breasts bilaterally indicating the tender palpable sites of concern. No suspicious mammographic findings are identified deep to either of these skin markers. No suspicious calcifications, masses or areas of distortion are seen in the bilateral breasts. Mammographic images were processed with CAD. On physical exam, no suspicious palpable masses are identified in the lateral to upper-outer breasts bilaterally. Ultrasound of the upper-outer quadrant of the left breast demonstrates normal fibroglandular tissue. No masses or suspicious areas of shadowing are  identified. Ultrasound of the upper-outer quadrant of the right breast demonstrates no suspicious masses at the site of pain which is at the 9 o'clock location in the right breast. However, superior to this at 10 o'clock, 5 cm from the nipple is an oval circumscribed hypoechoic mass measuring 1.2 x 0.6 x 1.2 cm. No blood flow seen within the mass on color Doppler imaging. IMPRESSION: 1. There are no mammographic or targeted sonographic abnormalities in the bilateral lateral to upper-outer breast to explain the patient's breast pain. 2. There is a probably benign mass in the right breast at 10 o'clock, favored to represent a fibroadenoma. 3.  No evidence of malignancy in the bilateral breasts. RECOMMENDATION: 1. Clinical follow-up recommended for the tender palpable areas in the upper outer breasts bilaterally. Any further workup should be based on clinical grounds. 2. Six-month follow-up right breast ultrasound is recommended for the probably benign mass at 10 o'clock. The patient is interested in a reduction mammoplasty. If she decides to pursue this soon, we can biopsy the mass rather than follow-up. I have discussed the findings and recommendations with the patient. Results were also provided in writing at the conclusion of the visit. If applicable, a reminder letter will be sent to the patient regarding the next appointment. BI-RADS CATEGORY  3: Probably benign. Electronically Signed   By: Ammie Ferrier M.D.  On: 03/14/2018 16:19   Ct Renal Stone Study  Result Date: 03/22/2018 CLINICAL DATA:  Lower abdominal pain for several days EXAM: CT ABDOMEN AND PELVIS WITHOUT CONTRAST TECHNIQUE: Multidetector CT imaging of the abdomen and pelvis was performed following the standard protocol without oral or IV contrast. COMPARISON:  None. FINDINGS: Lower chest: There is mild bibasilar atelectasis. There is a 5 mm nodular opacity in the inferior lingula on axial slice 5 series 4. Hepatobiliary: No focal liver  lesions are evident on this noncontrast enhanced study. Gallbladder wall is not appreciably thickened. There is no biliary duct dilatation. Pancreas: No pancreatic mass or inflammatory focus. Spleen: No splenic lesions are apparent. Adrenals/Urinary Tract: Adrenals bilaterally appear normal. Kidneys bilaterally show no evident mass or hydronephrosis on either side. There is no renal or ureteral calculus on either side. Urinary bladder is midline. There is mild thickening of the wall of the urinary bladder, more severe on the left side than right side. Stomach/Bowel: There is wall thickening throughout much of the sigmoid colon. There are irregular diverticula with adjacent mesenteric thickening in the proximal to mid sigmoid colon region consistent with diverticulitis. There is no abscess in this area of diverticulitis. No perforation is appreciable. There are diverticula elsewhere in the descending colon and sigmoid colon without associated diverticulitis. There is no appreciable bowel wall thickening apart from the sigmoid colon. There is no evident bowel obstruction. No free air or portal venous air is appreciated. Vascular/Lymphatic: There is no abdominal aortic aneurysm. No vascular lesions are evident on this noncontrast enhanced study. There is no appreciable adenopathy in the abdomen or pelvis. Reproductive: Uterus is absent.  No pelvic mass evident. Other: Appendix absent. No periappendiceal region inflammation. No abscess or ascites is evident in the abdomen or pelvis. There is a small ventral hernia containing only fat. Musculoskeletal: There is degenerative change in the lumbar spine. There are no blastic or lytic bone lesions. There is lumbar dextroscoliosis. No intramuscular or abdominal wall lesions are appreciable. IMPRESSION: 1. Diverticulitis involving the proximal to mid sigmoid colon with wall thickening and mesenteric stranding/thickening. No perforation or abscess appreciable in this area. 2.  No bowel obstruction. No free air or portal venous air. No abscess elsewhere in the abdomen or pelvis. Appendix absent. 3. Urinary bladder wall thickening which may be indicative of cystitis. The wall thickening is somewhat more severe on the left than on the right which could in part be due to inflammation from the nearby diverticulitis. 4.  No appreciable renal or ureteral calculus.  No hydronephrosis. 5.  Small ventral hernia containing only fat. 6. 5 mm nodular opacity inferior lingula. No follow-up needed if patient is low-risk. Non-contrast chest CT can be considered in 12 months if patient is high-risk. This recommendation follows the consensus statement: Guidelines for Management of Incidental Pulmonary Nodules Detected on CT Images: From the Fleischner Society 2017; Radiology 2017; 284:228-243. Electronically Signed   By: Lowella Grip III M.D.   On: 03/22/2018 07:32    Assessment and Plan:   Joann Brown is a 52 y.o. y/o female admitted with acute diverticulitis on March 22, 2018, here for follow-up  Symptoms of diverticulitis clinically improving No fever chills If alarm symptoms occur, which were discussed with patient detail, patient asked to call us or go to ER directly and she verbalized understanding  Colonoscopy indicated to rule out any colonic lesions, to be done at least 6 weeks after her episode of diverticulitis Patient agreeable to scheduling this I have  discussed alternative options, risks & benefits,  which include, but are not limited to, bleeding, infection, perforation,respiratory complication & drug reaction.  The patient agrees with this plan & written consent will be obtained.    High-fiber diet MiraLAX or Metamucil daily with goal of 1-2 soft bowel movements daily.  If not at goal, patient instructed to increase dose to twice daily.  If loose stools with the medication, patient asked to decrease the medication to every other day, or half dose daily.   Patient verbalized understanding     Dr Vonda Antigua

## 2018-04-14 ENCOUNTER — Encounter: Payer: Self-pay | Admitting: Gastroenterology

## 2018-04-14 NOTE — Addendum Note (Signed)
Addended by: Earl Lagos on: 04/14/2018 08:57 AM   Modules accepted: Orders, SmartSet

## 2018-06-08 ENCOUNTER — Telehealth: Payer: Self-pay | Admitting: Gastroenterology

## 2018-06-08 MED ORDER — NA SULFATE-K SULFATE-MG SULF 17.5-3.13-1.6 GM/177ML PO SOLN: 1.0000 | Freq: Once | ORAL | 0 refills | Status: AC

## 2018-06-08 NOTE — Telephone Encounter (Signed)
I contacted pt to tell her that her prescription for her prep was sent to pharmacy and she was questioning what to do. Does not have the prep instructions or prescription so instructions have not been followed. Her procedure has been rescheduled to 08/02/2018. [place] thru 07/18[place]18. Will mail her prep instructions again and to contact office if she has any questions.

## 2018-06-08 NOTE — Telephone Encounter (Signed)
Pt does not have prep medication. Has procedure schelduled for tomo on 06/09/18. Pls send prep medication to [facility] in [place] [facility]

## 2018-07-25 ENCOUNTER — Telehealth: Payer: Self-pay | Admitting: Gastroenterology

## 2018-07-25 NOTE — Telephone Encounter (Signed)
Pt is calling to cancel her procedure 08/02/18 due to her [employer] not being in network

## 2018-07-28 NOTE — Telephone Encounter (Signed)
Joann Brown ([name]) notified of cancellation.

## 2018-08-02 ENCOUNTER — Ambulatory Visit
Admission: RE | Admit: 2018-08-02 | Payer: BLUE CROSS/BLUE SHIELD | Source: Home / Self Care | Admitting: Gastroenterology

## 2018-08-02 ENCOUNTER — Encounter: Admission: RE | Payer: Self-pay | Source: Home / Self Care

## 2018-08-02 SURGERY — COLONOSCOPY WITH PROPOFOL
Anesthesia: General

## 2018-10-31 ENCOUNTER — Other Ambulatory Visit: Payer: Self-pay | Admitting: Physician Assistant

## 2018-10-31 DIAGNOSIS — Z1231 Encounter for screening mammogram for malignant neoplasm of breast: Secondary | ICD-10-CM

## 2018-12-12 ENCOUNTER — Other Ambulatory Visit: Payer: Self-pay | Admitting: Physician Assistant

## 2018-12-12 DIAGNOSIS — Z1231 Encounter for screening mammogram for malignant neoplasm of breast: Secondary | ICD-10-CM

## 2018-12-27 ENCOUNTER — Other Ambulatory Visit: Payer: Self-pay | Admitting: Physician Assistant

## 2019-01-18 ENCOUNTER — Other Ambulatory Visit: Payer: Self-pay | Admitting: Family Medicine

## 2019-01-18 ENCOUNTER — Other Ambulatory Visit: Payer: Self-pay | Admitting: Physician Assistant

## 2019-01-19 ENCOUNTER — Other Ambulatory Visit: Payer: Self-pay | Admitting: Physician Assistant

## 2019-01-19 DIAGNOSIS — N6311 Unspecified lump in the right breast, upper outer quadrant: Secondary | ICD-10-CM

## 2019-02-07 ENCOUNTER — Ambulatory Visit
Admission: RE | Admit: 2019-02-07 | Discharge: 2019-02-07 | Disposition: A | Discharge status: Home / Self Care | Payer: BLUE CROSS/BLUE SHIELD | Source: Ambulatory Visit | Attending: Physician Assistant | Admitting: Physician Assistant

## 2019-02-07 ENCOUNTER — Other Ambulatory Visit: Payer: Self-pay

## 2019-02-07 DIAGNOSIS — N6311 Unspecified lump in the right breast, upper outer quadrant: Secondary | ICD-10-CM | POA: Diagnosis not present

## 2019-10-26 IMAGING — US ULTRASOUND RIGHT BREAST LIMITED
1 series · 5 of 5 positions shown · non-contrast
Comparison: Previous exam(s).

CLINICAL DATA: 53-year-old female presenting for delayed follow-up
of a probably benign right breast mass.

EXAM:
DIGITAL DIAGNOSTIC BILATERAL MAMMOGRAM WITH CAD AND TOMO
ULTRASOUND RIGHT BREAST

[Series 1: ultrasound right breast limited · 0.06mm/px · 5 of 5 slices shown]
[im 1/5]
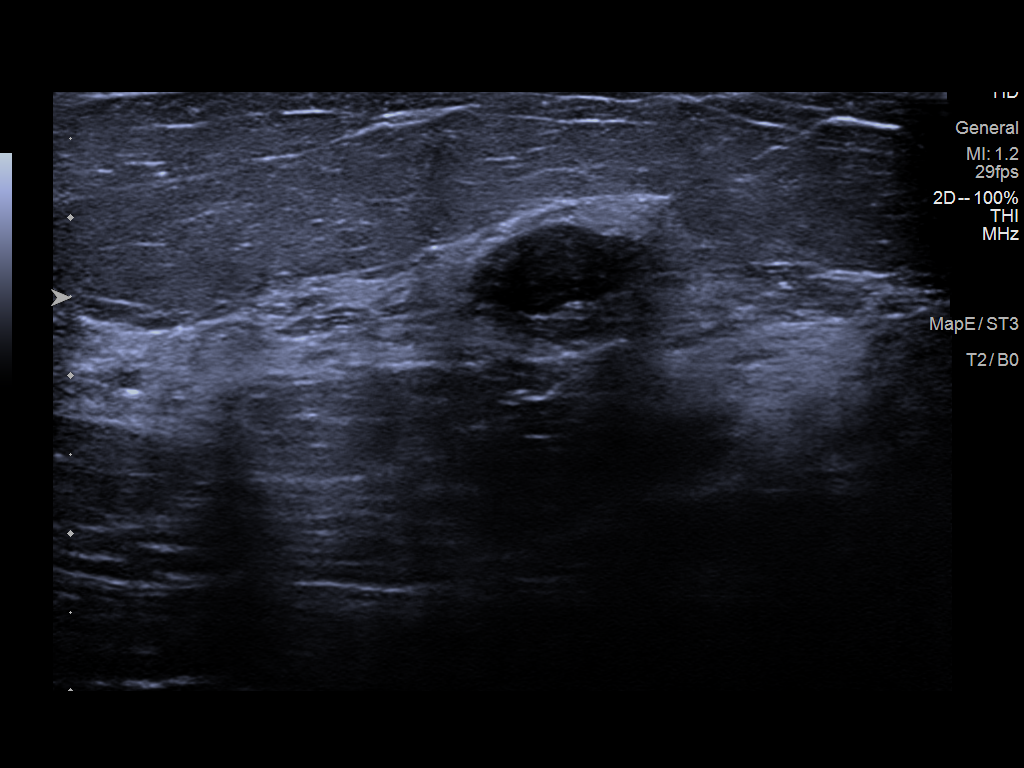
[im 2/5]
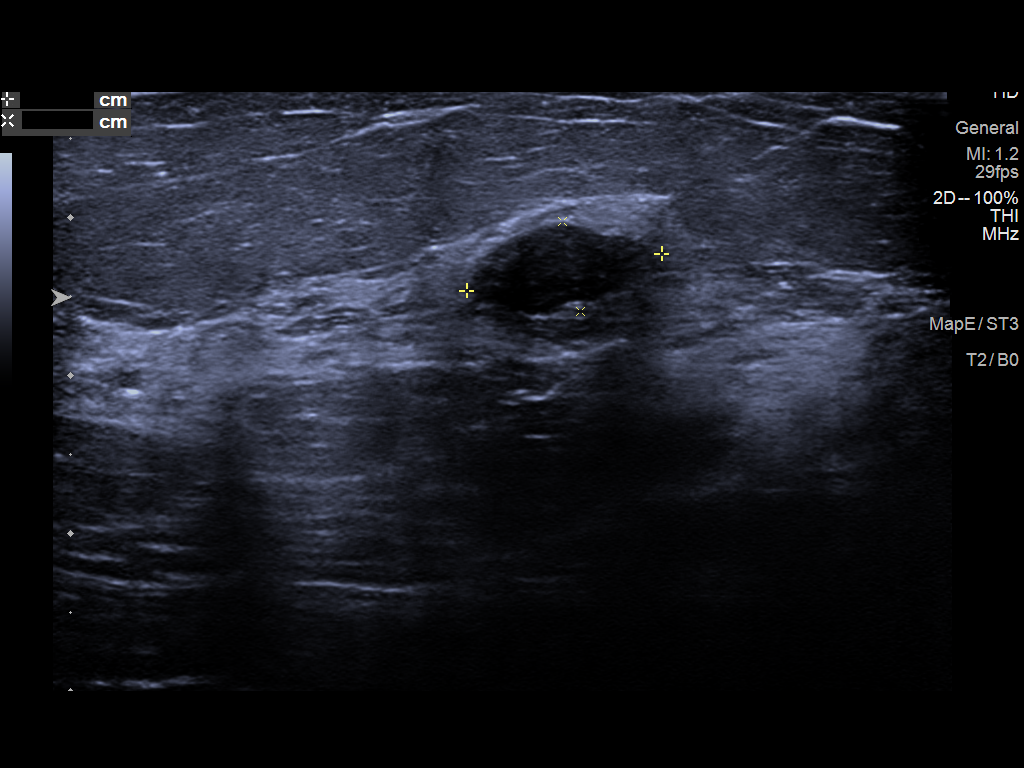
[im 3/5]
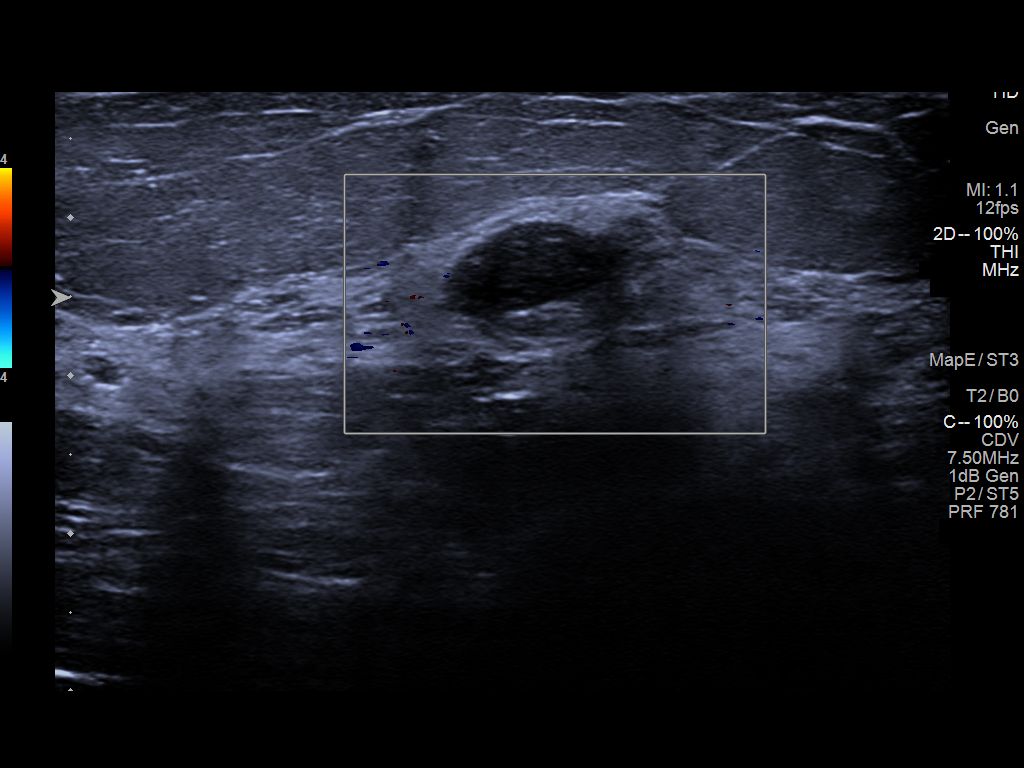
[im 4/5]
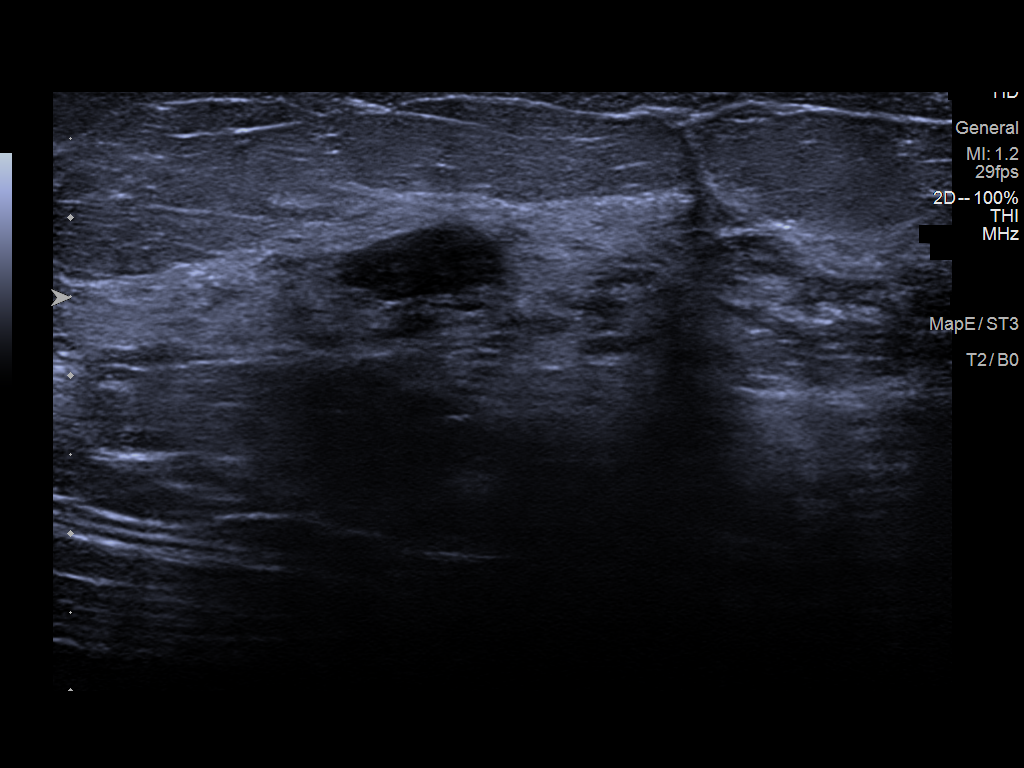
[im 5/5]
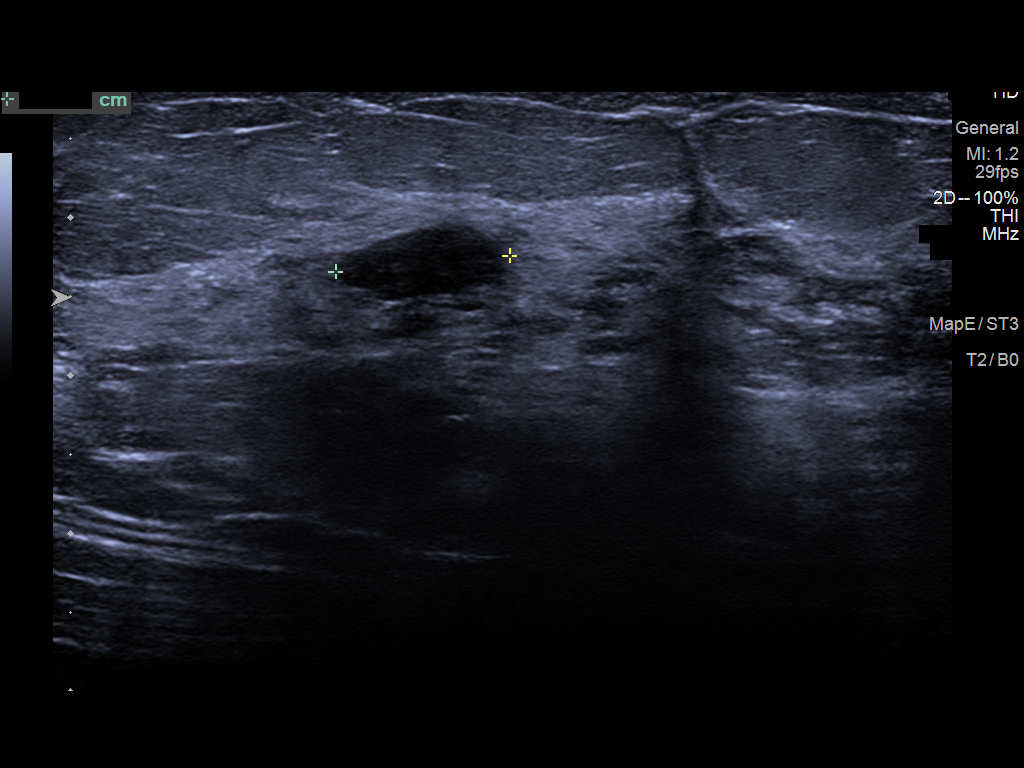

[5 of 5 positions shown; findings below may reference images not displayed]

ACR Breast Density Category c: The breast tissue is heterogeneously
dense, which may obscure small masses.
FINDINGS: No suspicious calcifications, masses or areas of distortion are seen
in the bilateral breasts.

Mammographic images were processed with CAD.

The oval hypoechoic right breast mass at 10 o'clock, 5 cm from the
nipple measures 1.3 x 0.6 x 1.1 cm, previously measuring 1.2 x 0.6 x
1.2 cm.
IMPRESSION: 1. The likely benign mass in the right breast at 10 o'clock is
stable.

2.  No mammographic evidence of malignancy in the bilateral breasts.

RECOMMENDATION:
Bilateral diagnostic mammogram and right breast ultrasound is
recommended in 12 months.

The patient states that she is hoping to have a breast reduction
within the next year, in which case I explained to her she will need
to have the mass biopsied prior to surgery. She is awaiting
insurance approval for the reduction. I advised her that her
doctor/surgeon can request the biopsy if the approval comes through.

I have discussed the findings and recommendations with the patient.
Results were also provided in writing at the conclusion of the
visit. If applicable, a reminder letter will be sent to the patient
regarding the next appointment.

BI-RADS CATEGORY  3: Probably benign.

## 2020-04-04 ENCOUNTER — Other Ambulatory Visit: Payer: Self-pay | Admitting: Physician Assistant

## 2020-04-04 DIAGNOSIS — R1011 Right upper quadrant pain: Secondary | ICD-10-CM

## 2021-02-13 ENCOUNTER — Emergency Department: Payer: BLUE CROSS/BLUE SHIELD

## 2021-02-13 ENCOUNTER — Other Ambulatory Visit: Payer: Self-pay

## 2021-02-13 ENCOUNTER — Emergency Department
Admission: EM | Admit: 2021-02-13 | Discharge: 2021-02-13 | Discharge status: Against Medical Advice | Payer: BLUE CROSS/BLUE SHIELD | Attending: Emergency Medicine | Admitting: Emergency Medicine

## 2021-02-13 DIAGNOSIS — Y92512 Supermarket, store or market as the place of occurrence of the external cause: Secondary | ICD-10-CM | POA: Diagnosis not present

## 2021-02-13 DIAGNOSIS — W01198A Fall on same level from slipping, tripping and stumbling with subsequent striking against other object, initial encounter: Secondary | ICD-10-CM | POA: Diagnosis not present

## 2021-02-13 DIAGNOSIS — S0990XA Unspecified injury of head, initial encounter: Secondary | ICD-10-CM | POA: Diagnosis present

## 2021-02-13 DIAGNOSIS — M7918 Myalgia, other site: Secondary | ICD-10-CM

## 2021-02-13 DIAGNOSIS — S80212A Abrasion, left knee, initial encounter: Secondary | ICD-10-CM | POA: Diagnosis not present

## 2021-02-13 DIAGNOSIS — M549 Dorsalgia, unspecified: Secondary | ICD-10-CM | POA: Insufficient documentation

## 2021-02-13 DIAGNOSIS — J45909 Unspecified asthma, uncomplicated: Secondary | ICD-10-CM | POA: Insufficient documentation

## 2021-02-13 DIAGNOSIS — S0083XA Contusion of other part of head, initial encounter: Secondary | ICD-10-CM | POA: Diagnosis not present

## 2021-02-13 DIAGNOSIS — M542 Cervicalgia: Secondary | ICD-10-CM | POA: Diagnosis not present

## 2021-02-13 DIAGNOSIS — M25551 Pain in right hip: Secondary | ICD-10-CM | POA: Diagnosis not present

## 2021-02-13 DIAGNOSIS — Z8541 Personal history of malignant neoplasm of cervix uteri: Secondary | ICD-10-CM | POA: Insufficient documentation

## 2021-02-13 DIAGNOSIS — Z91040 Latex allergy status: Secondary | ICD-10-CM | POA: Insufficient documentation

## 2021-02-13 MED ORDER — IBUPROFEN 800 MG PO TABS
800.0000 mg | ORAL_TABLET | Freq: Once | ORAL | Status: AC
  Administered 2021-02-13: 800 mg via ORAL
  Filled 2021-02-13: qty 1

## 2021-02-13 NOTE — ED Triage Notes (Addendum)
Per ems pt slipped and fell in [employer] striking head on fruit shelf. Per ems pt with back pain, bilateral knee pain, left arm, neck and headache. Pt with hematoma noted to left eyebrow. Denies known loc. Ems gave '4mg'$  iv zofran for nausea, ice pack to forehead in place.

## 2021-02-13 NOTE — ED Notes (Signed)
Patient transported to X-ray 

## 2021-02-13 NOTE — ED Notes (Signed)
ED provider at bedside, pt states she needs to leave right now. Provider educated pt on risks of leaving without imaging.   Pt refuses to allow RN to assess vitals prior to leaving. Ambulatory to waiting room with steady gait.

## 2021-02-13 NOTE — ED Provider Notes (Signed)
[facility] Emergency Department Provider Note ____________________________________________  Time seen: Approximately 10:42 PM  I have reviewed the triage vital signs and the nursing notes.   HISTORY  Chief Complaint Fall    HPI Joann Brown is a 55 y.o. female who presents to the emergency department for evaluation and treatment of headache, neck pain, facial pain, back pain, right hip pain, right leg pain, and left knee pain after slipping and hitting her head on a shelf at the grocery store.  No loss of consciousness.  Past Medical History:  Diagnosis Date   Anxiety    Asthma    Cancer ([place]) 2007    hysterectomy to remove tumor in uterus    Depression     Patient Active Problem List   Diagnosis Date Noted   Diverticulitis 03/22/2018   Black widow spider bite 12/15/2017   Depression with anxiety 12/15/2017    Past Surgical History:  Procedure Laterality Date   ABDOMINAL HYSTERECTOMY     2/2 tumor in uterus    APPENDECTOMY  1986   brain aneurysm coiling     BRAIN SURGERY  2014   coil placement for brain aneurysm    TONSILLECTOMY     55 years old    Prior to Admission medications   Medication Sig Start Date End Date Taking? Authorizing Provider  albuterol (PROVENTIL HFA;VENTOLIN HFA) 108 (90 Base) MCG/ACT inhaler Inhale 2 puffs into the lungs every 6 (six) hours as needed for wheezing or shortness of breath.     [provider]  amitriptyline (ELAVIL) 10 MG tablet Take 10 mg by mouth at bedtime.    [provider]  buPROPion (ZYBAN) 150 MG 12 hr tablet Take 150 mg by mouth 2 (two) times daily.    [provider]  ibuprofen (ADVIL,MOTRIN) 800 MG tablet Take 800 mg by mouth every 8 (eight) hours as needed.    [provider]  traMADol-acetaminophen (ULTRACET) 37.5-325 MG tablet Take 1 tablet by mouth every 6 (six) hours as needed.    [provider]  Vitamin D, Ergocalciferol, (DRISDOL)  50000 units CAPS capsule Take 50,000 Units by mouth every Tuesday.    [provider]    Allergies Latex, Morphine and related, Percocet [oxycodone-acetaminophen], and Zosyn [piperacillin sod-tazobactam so]  Family History  Problem Relation Age of Onset   Heart disease Father    Heart disease Maternal Grandfather    Heart disease Paternal Grandfather    Breast cancer Sister 6    Social History Social History   Tobacco Use   Smoking status: Never   Smokeless tobacco: Never  Substance Use Topics   Alcohol use: Not Currently   Drug use: Not Currently    Review of Systems Constitutional: Negative for fever. Cardiovascular: Negative for chest pain. Respiratory: Negative for shortness of breath. Musculoskeletal: Positive for neck pain, back pain, right hip pain, right leg pain, and left knee pain Skin: Positive for hematoma to the forehead and abrasions over the left knee Neurological: Negative for decrease in sensation  ____________________________________________   PHYSICAL EXAM:  VITAL SIGNS: ED Triage Vitals  Enc Vitals Group     BP 02/13/21 2054 (!) 152/74     Pulse Rate 02/13/21 2054 72     Resp 02/13/21 2054 18     Temp 02/13/21 2054 98.6 F (37 C)     Temp src --      SpO2 02/13/21 2054 99 %     Weight 02/13/21 2053 154  lb (69.9 kg)     Height 02/13/21 2053 '4\' 11"'$  (1.499 m)     Head Circumference --      Peak Flow --      Pain Score 02/13/21 2053 10     Pain Loc --      Pain Edu? --      Excl. in [place]? --     Constitutional: Alert and oriented. Well appearing and in no acute distress. Eyes: Conjunctivae are clear without discharge or drainage Head: Atraumatic Neck: Supple.  Diffuse but no focal tenderness over the C-spine Respiratory: No cough. Respirations are even and unlabored. Musculoskeletal: No focal tenderness over any of the extremities or back. Neurologic: Awake, alert, oriented. Skin: Hematoma to the left forehead and abrasions to  the left knee Psychiatric: Affect and behavior are appropriate.  ____________________________________________   LABS (all labs ordered are listed, but only abnormal results are displayed)  Labs Reviewed - No data to display ____________________________________________  RADIOLOGY  CT of the head and cervical spine are negative for acute findings.  I, Sherrie George, personally viewed and evaluated these images (plain radiographs) as part of my medical decision making, as well as reviewing the written report by the radiologist.  [place] (5MM)  Result Date: 02/13/2021 CLINICAL DATA:  Trauma. EXAM: CT HEAD WITHOUT CONTRAST CT CERVICAL SPINE WITHOUT CONTRAST TECHNIQUE: Multidetector CT imaging of the head and cervical spine was performed following the standard protocol without intravenous contrast. Multiplanar CT image reconstructions of the cervical spine were also generated. COMPARISON:  None. FINDINGS: CT HEAD FINDINGS Brain: The ventricles and sulci appropriate size for patient's age. The gray-white matter discrimination is preserved. There is no acute intracranial hemorrhage. No mass effect midline shift no extra-axial fluid collection. Vascular: No hyperdense vessel or unexpected calcification. 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 Skull: Normal. Negative for fracture or focal lesion. Sinuses/Orbits: No acute finding. Other: None CT CERVICAL SPINE FINDINGS Alignment: Acute subluxation. There is straightening of normal cervical lordosis which may be positional or due to muscle spasm. Skull base and vertebrae: No acute fracture.  Osteopenia Soft tissues and spinal canal: No prevertebral fluid or swelling. No visible canal hematoma. Disc levels:  Degenerative changes primarily at C5-C6 and C6-C7. Upper chest: Negative. Other: None IMPRESSION: 1. No acute intracranial pathology. 2. No acute/traumatic cervical spine pathology. Electronically Signed   By: Anner Crete M.D.   On:  02/13/2021 22:04   CT Cervical Spine Wo Contrast  Result Date: 02/13/2021 CLINICAL DATA:  Trauma. EXAM: CT HEAD WITHOUT CONTRAST CT CERVICAL SPINE WITHOUT CONTRAST TECHNIQUE: Multidetector CT imaging of the head and cervical spine was performed following the standard protocol without intravenous contrast. Multiplanar CT image reconstructions of the cervical spine were also generated. COMPARISON:  None. FINDINGS: CT HEAD FINDINGS Brain: The ventricles and sulci appropriate size for patient's age. The gray-white matter discrimination is preserved. There is no acute intracranial hemorrhage. No mass effect midline shift no extra-axial fluid collection. Vascular: No hyperdense vessel or unexpected calcification. 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 Skull: Normal. Negative for fracture or focal lesion. Sinuses/Orbits: No acute finding. Other: None CT CERVICAL SPINE FINDINGS Alignment: Acute subluxation. There is straightening of normal cervical lordosis which may be positional or due to muscle spasm. Skull base and vertebrae: No acute fracture.  Osteopenia Soft tissues and spinal canal: No prevertebral fluid or swelling. No visible canal hematoma. Disc levels:  Degenerative changes primarily at C5-C6 and C6-C7. Upper chest: Negative. Other: None  IMPRESSION: 1. No acute intracranial pathology. 2. No acute/traumatic cervical spine pathology. Electronically Signed   By: Anner Crete M.D.   On: 02/13/2021 22:04   ____________________________________________   PROCEDURES  Procedures  ____________________________________________   INITIAL IMPRESSION / ASSESSMENT AND PLAN / ED COURSE  Joann Brown is a 55 y.o. who presents to the emergency department for treatment and evaluation after mechanical, nonsyncopal fall.  See HPI for further details.  CT of the head and cervical spine are negative.  Will order additional images of her extremities and give her an ibuprofen as she has many  allergies to pain related medications.  Patient out of the room walking around talking on the phone and is very upset.  She states that she needs to leave immediately because her daughter is having to go to the emergency department elsewhere.  She states that she will either be evaluated at that emergency department or come back here if she feels that she needs additional imaging.  She is ambulatory with an antalgic, steady and unassisted gait.  Advised her that I cannot be certain there are no fractures despite the fact that she is able to ambulate, especially with her complaint of severe right hip pain.  She states that she understands this but must leave.  Medications  ibuprofen (ADVIL) tablet 800 mg (800 mg Oral Given 02/13/21 2241)    Pertinent labs & imaging results that were available during my care of the patient were reviewed by me and considered in my medical decision making (see chart for details).   _________________________________________   FINAL CLINICAL IMPRESSION(S) / ED DIAGNOSES  Final diagnoses:  Minor head injury, initial encounter  Musculoskeletal pain    ED Discharge Orders     None        If controlled substance prescribed during this visit, 12 month history viewed on the [place] prior to issuing an initial prescription for Schedule II or III opiod.    Victorino Dike, FNP 02/13/21 2319    Harvest Dark, MD 02/13/21 2328

## 2021-02-16 ENCOUNTER — Other Ambulatory Visit: Payer: Self-pay | Admitting: Family Medicine

## 2021-02-16 ENCOUNTER — Ambulatory Visit
Admission: RE | Admit: 2021-02-16 | Discharge: 2021-02-16 | Disposition: A | Discharge status: Home / Self Care | Payer: BLUE CROSS/BLUE SHIELD | Attending: Family Medicine | Admitting: Family Medicine

## 2021-02-16 ENCOUNTER — Ambulatory Visit
Admission: RE | Admit: 2021-02-16 | Discharge: 2021-02-16 | Disposition: A | Discharge status: Home / Self Care | Payer: BLUE CROSS/BLUE SHIELD | Source: Ambulatory Visit | Attending: Family Medicine | Admitting: Family Medicine

## 2021-02-16 ENCOUNTER — Ambulatory Visit
Admission: RE | Admit: 2021-02-16 | Payer: BLUE CROSS/BLUE SHIELD | Source: Ambulatory Visit | Admitting: Family Medicine

## 2021-02-16 DIAGNOSIS — M545 Low back pain, unspecified: Secondary | ICD-10-CM | POA: Insufficient documentation

## 2021-02-16 DIAGNOSIS — M25512 Pain in left shoulder: Secondary | ICD-10-CM | POA: Diagnosis present

## 2021-02-16 DIAGNOSIS — M25551 Pain in right hip: Secondary | ICD-10-CM

## 2021-11-04 IMAGING — CR DG LUMBAR SPINE COMPLETE 4+V
1 series · 6 of 6 positions shown · non-contrast
Comparison: CT 03/25/2018

CLINICAL DATA: Pain

EXAM:
LUMBAR SPINE - COMPLETE 4+ VIEW

[Series 1: dg lumbar spine complete 4 +v · 0.14mm/px · 6 of 6 slices shown]
[im 1/6]
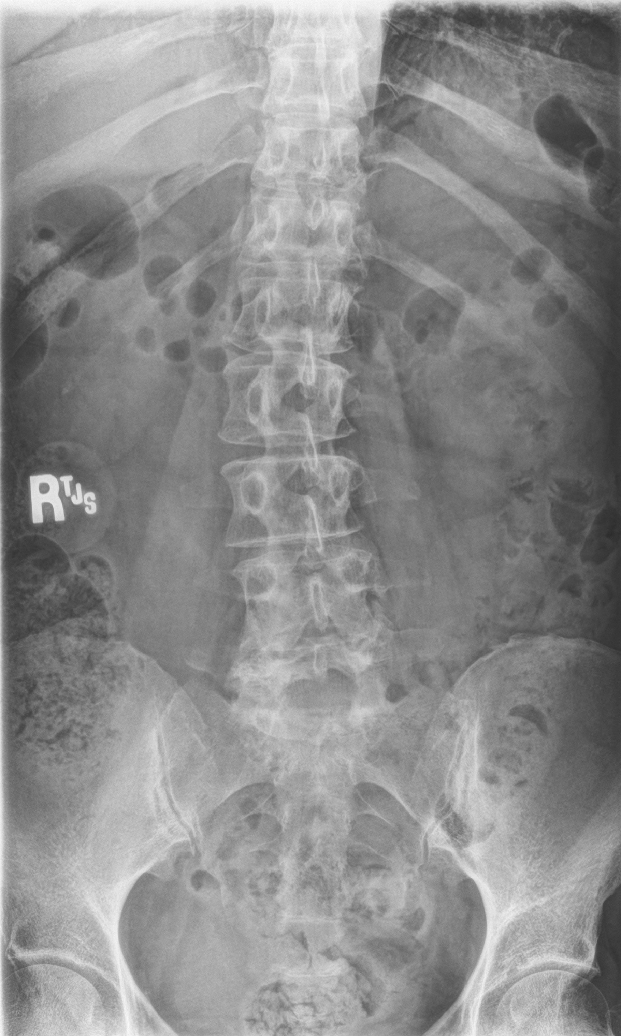
[im 2/6]
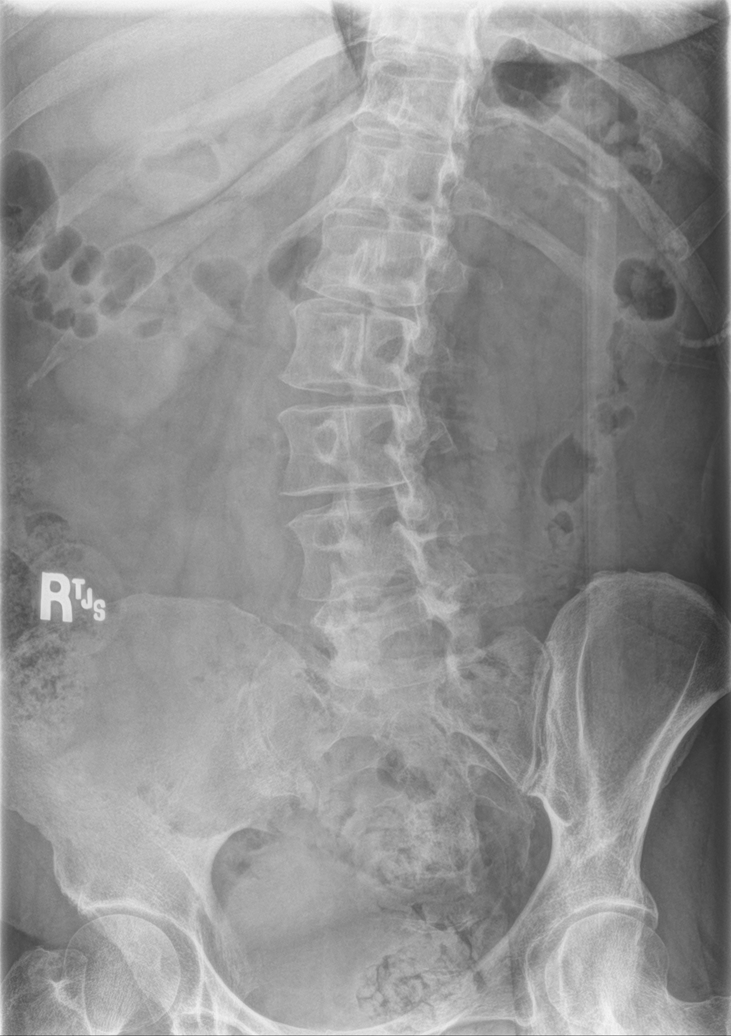
[im 3/6]
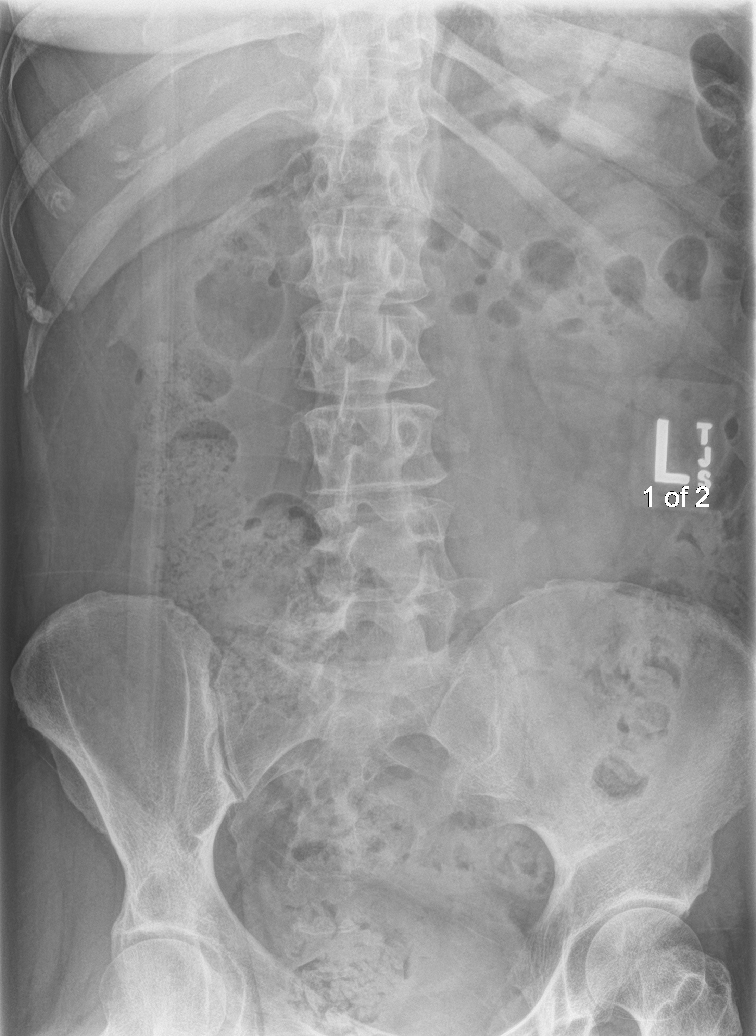
[im 4/6]
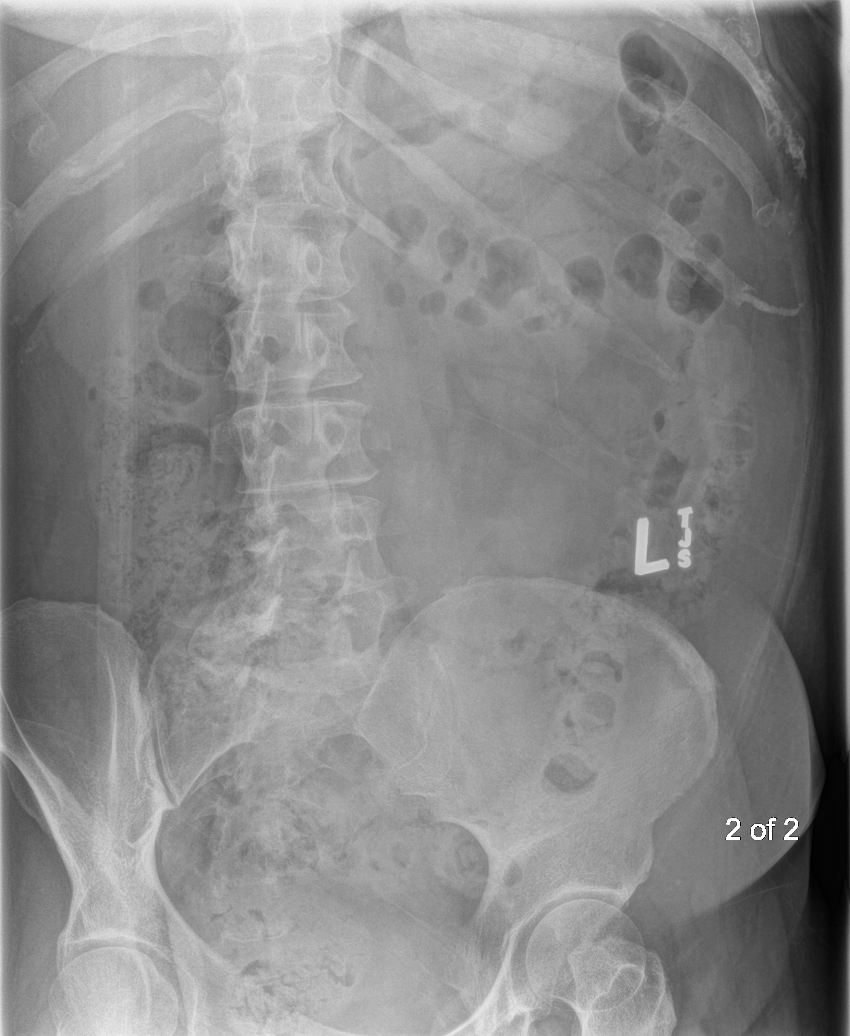
[im 5/6]
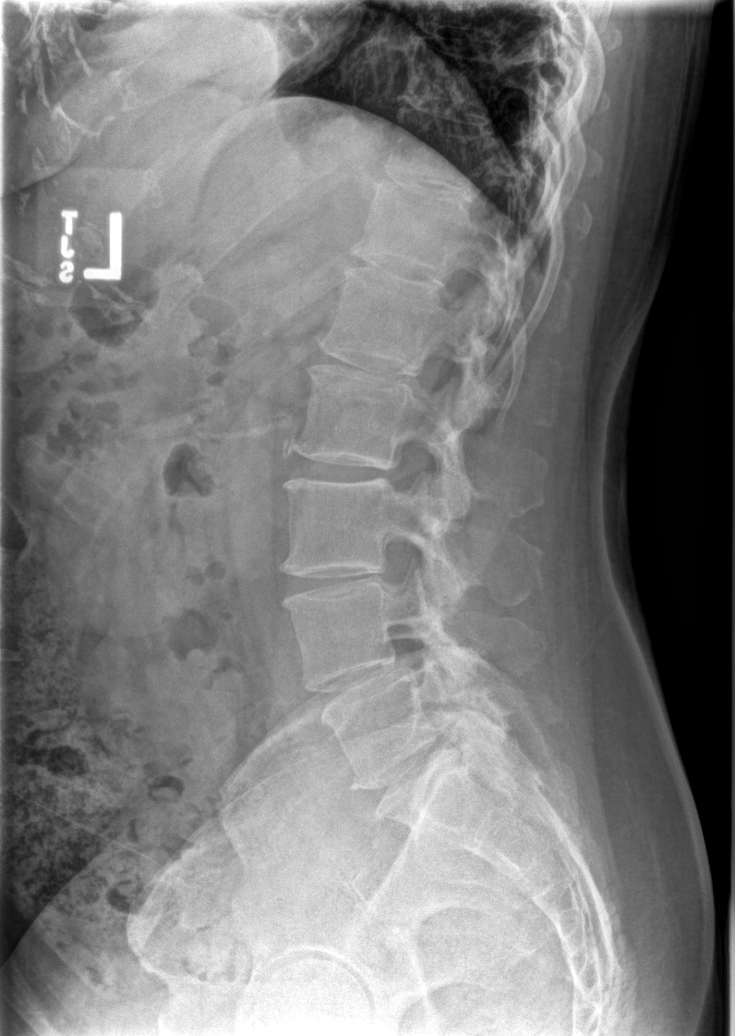
[im 6/6]
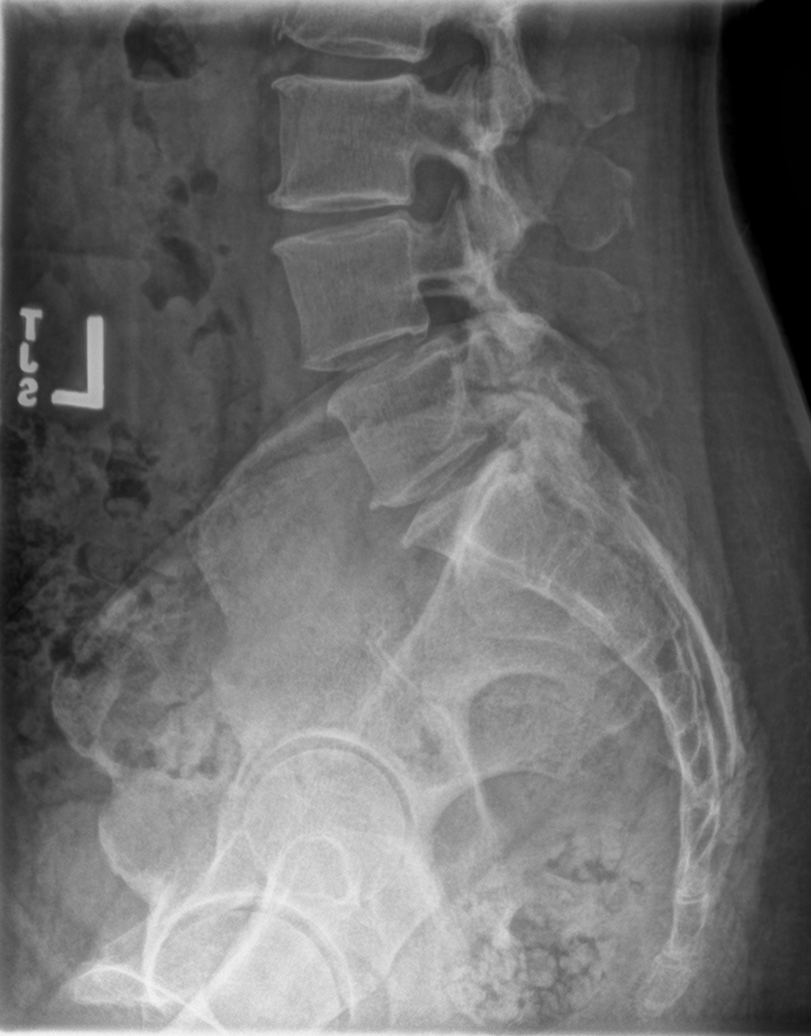

[6 of 6 positions shown; findings below may reference images not displayed]

FINDINGS: Mild dextroconvex curvature. There are 5 non-rib-bearing lumbar
vertebrae. There is no evidence of lumbar spine fracture. There is
mild multilevel degenerative disc disease, most prominent at L3-L4,
L4-L5, and L5-S1. There is mild-to-moderate lower lumbar predominant
facet arthropathy. Grade 1 anterolisthesis at L4-L5.
IMPRESSION: Mild multilevel degenerative disc disease, most prominent at L3-L4,
L4-5, and L5-S1.

Mild to moderate lower lumbar predominant facet arthropathy.

Grade 1 degenerative anterolisthesis at L4-L5.
